# Patient Record
Sex: Male | Born: 1970 | Race: White | Hispanic: No | Marital: Married | Smoking: Never smoker
Health system: Southern US, Community
[De-identification: ages and names within clinical notes are randomized; demographics above are authoritative.]

## PROBLEM LIST (undated history)

## (undated) DIAGNOSIS — R112 Nausea with vomiting, unspecified: Secondary | ICD-10-CM

## (undated) DIAGNOSIS — I10 Essential (primary) hypertension: Secondary | ICD-10-CM

## (undated) DIAGNOSIS — K219 Gastro-esophageal reflux disease without esophagitis: Secondary | ICD-10-CM

## (undated) DIAGNOSIS — N200 Calculus of kidney: Secondary | ICD-10-CM

## (undated) DIAGNOSIS — F419 Anxiety disorder, unspecified: Secondary | ICD-10-CM

## (undated) DIAGNOSIS — T4145XA Adverse effect of unspecified anesthetic, initial encounter: Secondary | ICD-10-CM

## (undated) DIAGNOSIS — K620 Anal polyp: Secondary | ICD-10-CM

## (undated) DIAGNOSIS — T7840XA Allergy, unspecified, initial encounter: Secondary | ICD-10-CM

## (undated) DIAGNOSIS — G4733 Obstructive sleep apnea (adult) (pediatric): Secondary | ICD-10-CM

## (undated) DIAGNOSIS — M7541 Impingement syndrome of right shoulder: Secondary | ICD-10-CM

## (undated) DIAGNOSIS — K6282 Dysplasia of anus: Secondary | ICD-10-CM

## (undated) DIAGNOSIS — Z9889 Other specified postprocedural states: Secondary | ICD-10-CM

## (undated) DIAGNOSIS — R635 Abnormal weight gain: Secondary | ICD-10-CM

## (undated) DIAGNOSIS — K649 Unspecified hemorrhoids: Secondary | ICD-10-CM

## (undated) DIAGNOSIS — G4730 Sleep apnea, unspecified: Secondary | ICD-10-CM

## (undated) DIAGNOSIS — J309 Allergic rhinitis, unspecified: Secondary | ICD-10-CM

## (undated) DIAGNOSIS — J3089 Other allergic rhinitis: Secondary | ICD-10-CM

## (undated) DIAGNOSIS — Z87442 Personal history of urinary calculi: Secondary | ICD-10-CM

## (undated) HISTORY — DX: Abnormal weight gain: R63.5

## (undated) HISTORY — DX: Gastro-esophageal reflux disease without esophagitis: K21.9

## (undated) HISTORY — DX: Obstructive sleep apnea (adult) (pediatric): G47.33

## (undated) HISTORY — DX: Impingement syndrome of right shoulder: M75.41

## (undated) HISTORY — DX: Anxiety disorder, unspecified: F41.9

---
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
## 2014-01-17 ENCOUNTER — Emergency Department (HOSPITAL_COMMUNITY)
Admission: EM | Admit: 2014-01-17 | Discharge: 2014-01-17 | Disposition: A | Discharge status: Home / Self Care | Payer: BC Managed Care – PPO | Attending: Emergency Medicine | Admitting: Emergency Medicine

## 2014-01-17 ENCOUNTER — Emergency Department (HOSPITAL_COMMUNITY): Payer: BC Managed Care – PPO

## 2014-01-17 ENCOUNTER — Encounter (HOSPITAL_COMMUNITY): Payer: Self-pay | Admitting: Emergency Medicine

## 2014-01-17 DIAGNOSIS — Z87442 Personal history of urinary calculi: Secondary | ICD-10-CM | POA: Insufficient documentation

## 2014-01-17 DIAGNOSIS — M542 Cervicalgia: Secondary | ICD-10-CM | POA: Insufficient documentation

## 2014-01-17 DIAGNOSIS — I10 Essential (primary) hypertension: Secondary | ICD-10-CM | POA: Insufficient documentation

## 2014-01-17 DIAGNOSIS — R52 Pain, unspecified: Secondary | ICD-10-CM

## 2014-01-17 DIAGNOSIS — M62838 Other muscle spasm: Secondary | ICD-10-CM

## 2014-01-17 DIAGNOSIS — Z79899 Other long term (current) drug therapy: Secondary | ICD-10-CM | POA: Insufficient documentation

## 2014-01-17 DIAGNOSIS — M549 Dorsalgia, unspecified: Secondary | ICD-10-CM | POA: Insufficient documentation

## 2014-01-17 DIAGNOSIS — R2231 Localized swelling, mass and lump, right upper limb: Secondary | ICD-10-CM | POA: Diagnosis present

## 2014-01-17 HISTORY — DX: Essential (primary) hypertension: I10

## 2014-01-17 HISTORY — DX: Calculus of kidney: N20.0

## 2014-01-17 LAB — CBC WITH DIFFERENTIAL/PLATELET
Basophils Absolute: 0 10*3/uL (ref 0.0–0.1)
Basophils Relative: 0 % (ref 0–1)
EOS ABS: 0.1 10*3/uL (ref 0.0–0.7)
EOS PCT: 2 % (ref 0–5)
HCT: 44.3 % (ref 39.0–52.0)
Hemoglobin: 15.3 g/dL (ref 13.0–17.0)
Lymphocytes Relative: 10 % — ABNORMAL LOW (ref 12–46)
Lymphs Abs: 0.7 10*3/uL (ref 0.7–4.0)
MCH: 28.8 pg (ref 26.0–34.0)
MCHC: 34.5 g/dL (ref 30.0–36.0)
MCV: 83.4 fL (ref 78.0–100.0)
MONOS PCT: 9 % (ref 3–12)
Monocytes Absolute: 0.7 10*3/uL (ref 0.1–1.0)
Neutro Abs: 6 10*3/uL (ref 1.7–7.7)
Neutrophils Relative %: 79 % — ABNORMAL HIGH (ref 43–77)
PLATELETS: 197 10*3/uL (ref 150–400)
RBC: 5.31 MIL/uL (ref 4.22–5.81)
RDW: 13 % (ref 11.5–15.5)
WBC: 7.6 10*3/uL (ref 4.0–10.5)

## 2014-01-17 LAB — BASIC METABOLIC PANEL
Anion gap: 13 (ref 5–15)
BUN: 15 mg/dL (ref 6–23)
CALCIUM: 8.7 mg/dL (ref 8.4–10.5)
CO2: 26 mEq/L (ref 19–32)
Chloride: 99 mEq/L (ref 96–112)
Creatinine, Ser: 0.9 mg/dL (ref 0.50–1.35)
GFR calc Af Amer: >90 mL/min (ref >90)
Glucose, Bld: 112 mg/dL — ABNORMAL HIGH (ref 70–99)
Potassium: 6.9 mEq/L (ref 3.7–5.3)
SODIUM: 138 meq/L (ref 137–147)

## 2014-01-17 LAB — D-DIMER, QUANTITATIVE
D-Dimer, Quant: 0.46 ug/mL-FEU (ref 0.00–0.48)
D-Dimer, Quant: 0.29 ug/mL-FEU (ref 0.00–0.48)

## 2014-01-17 LAB — TROPONIN I: Troponin I: <0.3 ng/mL (ref <0.30)

## 2014-01-17 LAB — POTASSIUM: Potassium: 4 mEq/L (ref 3.7–5.3)

## 2014-01-17 MED ORDER — DIAZEPAM 5 MG PO TABS
5.0000 mg | ORAL_TABLET | Freq: Once | ORAL | Status: AC
  Administered 2014-01-17: 5 mg via ORAL
  Filled 2014-01-17: qty 1

## 2014-01-17 MED ORDER — ONDANSETRON HCL 4 MG PO TABS
4.0000 mg | ORAL_TABLET | Freq: Once | ORAL | Status: AC
  Administered 2014-01-17: 4 mg via ORAL
  Filled 2014-01-17: qty 1

## 2014-01-17 MED ORDER — HYDROMORPHONE HCL 1 MG/ML IJ SOLN
1.0000 mg | Freq: Once | INTRAMUSCULAR | Status: AC
  Administered 2014-01-17: 1 mg via INTRAMUSCULAR
  Filled 2014-01-17: qty 1

## 2014-01-17 MED ORDER — IBUPROFEN 800 MG PO TABS: 800.0000 mg | ORAL_TABLET | Freq: Three times a day (TID) | ORAL | Status: DC

## 2014-01-17 MED ORDER — DIAZEPAM 2 MG PO TABS: 2.0000 mg | ORAL_TABLET | Freq: Two times a day (BID) | ORAL | Status: DC

## 2014-01-17 MED ORDER — HYDROCODONE-ACETAMINOPHEN 5-325 MG PO TABS: 2.0000 | ORAL_TABLET | ORAL | Status: DC | PRN

## 2014-01-17 MED ORDER — ONDANSETRON HCL 4 MG PO TABS: 4.0000 mg | ORAL_TABLET | Freq: Three times a day (TID) | ORAL | Status: DC | PRN

## 2014-01-17 MED ORDER — KETOROLAC TROMETHAMINE 60 MG/2ML IM SOLN
60.0000 mg | Freq: Once | INTRAMUSCULAR | Status: AC
  Administered 2014-01-17: 60 mg via INTRAMUSCULAR
  Filled 2014-01-17: qty 2

## 2014-01-17 MED ORDER — ONDANSETRON HCL 4 MG/2ML IJ SOLN
4.0000 mg | Freq: Once | INTRAMUSCULAR | Status: AC
  Administered 2014-01-17: 4 mg via INTRAVENOUS
  Filled 2014-01-17: qty 2

## 2014-01-17 MED ORDER — ONDANSETRON HCL 4 MG/2ML IJ SOLN
4.0000 mg | Freq: Once | INTRAMUSCULAR | Status: DC
  Filled 2014-01-17: qty 2

## 2014-01-17 NOTE — ED Notes (Signed)
Patient transported to X-ray 

## 2014-01-17 NOTE — Discharge Instructions (Signed)

## 2014-01-17 NOTE — ED Provider Notes (Signed)
CSN: [id number]     Arrival date & time 01/17/14  0350 History   First MD Initiated Contact with Patient 01/17/14 [id number]     Chief Complaint  Patient presents with  . Arm Swelling     (Consider location/radiation/quality/duration/timing/severity/associated sxs/prior Treatment) HPI Comments: Patient complains of right arm pain and upper back pain and neck pain. He states he got his flu shot yesterday around 4 PM and noticed around 10 PM and he had shooting pain in his right proximal arm radiating to his neck and upper back. Use some BenGay without relief. He reports difficulty using the right arm due to pain. Denies Chest pain or shortness of breath. Denies any falls or injuries. Denies any previous history of neck problems or back problems. Admits that he was working out recently but denies any injury. Denies any numbness or tingling.  The history is provided by the patient.    Past Medical History  Diagnosis Date  . Hypertension   . Kidney calculus    History reviewed. No pertinent past surgical history. History reviewed. No pertinent family history. History  Substance Use Topics  . Smoking status: Never Smoker   . Smokeless tobacco: Never Used  . Alcohol Use: No    Review of Systems  Constitutional: Negative for fever, activity change and appetite change.  Respiratory: Negative for cough, chest tightness and shortness of breath.   Cardiovascular: Negative for chest pain.  Gastrointestinal: Negative for nausea, vomiting and abdominal pain.  Genitourinary: Negative for dysuria and hematuria.  Musculoskeletal: Positive for arthralgias and myalgias. Negative for neck pain and neck stiffness.  Skin: Negative for rash.  Neurological: Negative for dizziness, weakness and headaches.  A complete 10 system review of systems was obtained and all systems are negative except as noted in the HPI and PMH.      Allergies  Review of patient's allergies indicates no known  allergies.  Home Medications   Prior to Admission medications   Medication Sig Start Date End Date Taking? Authorizing Provider  BYSTOLIC 10 MG tablet Take 10 mg by mouth daily.  12/25/13  Yes Historical Provider, MD  diazepam (VALIUM) 2 MG tablet Take 1 tablet (2 mg total) by mouth 2 (two) times daily. 01/17/14   Ezequiel Essex, MD  HYDROcodone-acetaminophen (NORCO/VICODIN) 5-325 MG per tablet Take 2 tablets by mouth every 4 (four) hours as needed. 01/17/14   Ezequiel Essex, MD  ibuprofen (ADVIL,MOTRIN) 800 MG tablet Take 1 tablet (800 mg total) by mouth 3 (three) times daily. 01/17/14   Ezequiel Essex, MD  ondansetron (ZOFRAN) 4 MG tablet Take 1 tablet (4 mg total) by mouth every 8 (eight) hours as needed for nausea or vomiting. 01/17/14   Debby Freiberg, MD   BP 137/88  Pulse 65  Temp(Src) 97.9 F (36.6 C) (Oral)  Resp 13  Ht 5\' 9"  (1.753 m)  Wt 195 lb (88.451 kg)  BMI 28.78 kg/m2  SpO2 98% Physical Exam  Nursing note and vitals reviewed. Constitutional: He is oriented to person, place, and time. He appears well-developed and well-nourished. He appears distressed.  uncomfortable  HENT:  Head: Normocephalic and atraumatic.  Mouth/Throat: Oropharynx is clear and moist. No oropharyngeal exudate.  Eyes: Conjunctivae and EOM are normal. Pupils are equal, round, and reactive to light.  Neck: Normal range of motion. Neck supple.  No meningismus.  Cardiovascular: Normal rate, regular rhythm, normal heart sounds and intact distal pulses.   No murmur heard. Pulmonary/Chest: Effort normal and breath sounds normal. No respiratory  distress.  Abdominal: Soft. There is no tenderness. There is no rebound and no guarding.  Musculoskeletal: Normal range of motion. He exhibits tenderness. He exhibits no edema.  TTP RUE with spasm. Able to abduct shoulder to horizontal. Tenderness to right upper trapezius and rhomboid with spasm Intact radial pulse. Intact cardinal hand movements.   Neurological: He is alert and oriented to person, place, and time. No cranial nerve deficit. He exhibits normal muscle tone. Coordination normal.  No ataxia on finger to nose bilaterally. No pronator drift. 5/5 strength throughout. CN 2-12 intact. Negative Romberg. Equal grip strength. Sensation intact. Gait is normal.   Skin: Skin is warm.  Psychiatric: He has a normal mood and affect. His behavior is normal.    ED Course  Procedures (including critical care time) Labs Review Labs Reviewed  CBC WITH DIFFERENTIAL - Abnormal; Notable for the following:    Neutrophils Relative % 79 (*)    Lymphocytes Relative 10 (*)    All other components within normal limits  BASIC METABOLIC PANEL - Abnormal; Notable for the following:    Potassium 6.9 (*)    Glucose, Bld 112 (*)    All other components within normal limits  TROPONIN I  D-DIMER, QUANTITATIVE  D-DIMER, QUANTITATIVE  POTASSIUM    Imaging Review Dg Chest 2 View  01/17/2014   CLINICAL DATA:  Neck and arm pain after flu shot today.  EXAM: CHEST  2 VIEW  COMPARISON:  None.  FINDINGS: The heart size and mediastinal contours are within normal limits. Both lungs are clear. The visualized skeletal structures are unremarkable.  IMPRESSION: No active cardiopulmonary disease.   Electronically Signed   By: Lucienne Capers M.D.   On: 01/17/2014 05:31   Dg Cervical Spine Complete  01/17/2014   CLINICAL DATA:  Flu shot today and now having neck and arm pain.  EXAM: CERVICAL SPINE  4+ VIEWS  COMPARISON:  None.  FINDINGS: There is no evidence of cervical spine fracture or prevertebral soft tissue swelling. Alignment is normal. No other significant bone abnormalities are identified.  IMPRESSION: Negative cervical spine radiographs.   Electronically Signed   By: Lucienne Capers M.D.   On: 01/17/2014 05:31     EKG Interpretation   Date/Time:  Friday January 17 2014 07:08:18 EDT Ventricular Rate:  85 PR Interval:  188 QRS Duration: 95 QT  Interval:  373 QTC Calculation: 443 R Axis:   51 Text Interpretation:  Sinus rhythm RSR' in V1 or V2, probably normal  variant Minimal ST elevation, inferior leads No previous ECGs available  Confirmed by Kemp [phone]) on 01/17/2014 7:26:30 AM      MDM   Final diagnoses:  Pain  Muscle spasm of right shoulder   Right arm, neck and upper back pain after receiving a flu shot. No CP or SOB.  Suspect muscle spasm.  Patient will receive narcotics and muscle relaxers.  EKG without acute ST changes. Troponin negative. D-dimer negative. Cervical spine xray negative.  Pain improved after treatment  Grip strengths equal. Distal sensation equal.  ROM of shoulder full. Elevated potassium on labs, likely from hemolysis.  Repeat pending at time of sign out to Dr. Colin Rhein.  Treat for muscle spasm. Apply heat. Follow up with PCP. Return precautions discussed.  Ezequiel Essex, MD 01/17/14 1440

## 2014-01-17 NOTE — ED Notes (Signed)
Pt states he got his flu shot yesterday at 4 pm, around 2200 pt started feeling some sharp pain on his right arm that is progressively getting worse to the point that he can't move his right arm or his neck to the right side.

## 2014-05-06 ENCOUNTER — Encounter: Payer: Self-pay | Admitting: Neurology

## 2014-05-07 ENCOUNTER — Ambulatory Visit (INDEPENDENT_AMBULATORY_CARE_PROVIDER_SITE_OTHER): Payer: BLUE CROSS/BLUE SHIELD | Admitting: Neurology

## 2014-05-07 ENCOUNTER — Encounter: Payer: Self-pay | Admitting: Neurology

## 2014-05-07 VITALS — BP 152/105 | HR 60 | Temp 97.4°F | Resp 17 | Ht 69.5 in | Wt 201.0 lb

## 2014-05-07 DIAGNOSIS — G4719 Other hypersomnia: Secondary | ICD-10-CM

## 2014-05-07 DIAGNOSIS — E663 Overweight: Secondary | ICD-10-CM

## 2014-05-07 DIAGNOSIS — I10 Essential (primary) hypertension: Secondary | ICD-10-CM

## 2014-05-07 DIAGNOSIS — G4733 Obstructive sleep apnea (adult) (pediatric): Secondary | ICD-10-CM

## 2014-05-07 NOTE — Progress Notes (Signed)
Subjective:    Patient ID: Joshua Collins is a 44 y.o. male.  HPI     Star Age, MD, PhD [facility] [address] [place], [place] [address]  Dear Dr. Ernie Hew,   I saw your patient, Joshua Collins, upon your kind request, in my neurologic clinic today for initial consultation of his sleep disorder, in particular, concern for underlying obstructive sleep apnea. The patient is unaccompanied today. As you know, Joshua Collins is a 44 year old right-handed gentleman with an underlying medical history of allergies, reflux disease, hypertension with elevated blood pressure values at times as well as overweight state, who reports snoring, witnessed apneas, waking up with a gasp at night and multiple nighttime awakenings as well as daytime tiredness. He reports a bedtime of 10 PM and usually falls asleep within 10-15 minutes. His rise time is 6 AM and he feels marginally rested when he wakes up with a poor drinks. He denies morning headaches. He denies nocturia. He denies restless leg symptoms. His sleep is disrupted because of witnessed apneas and he also wakes himself up with his loud snoring at times and a sense of gasping for air. His Epworth sleepiness score is 8 out of a maximum of 24 today. He does not typically nap. He denies parasomnias. He drinks half a cup of coffee each day in the mornings. He is a nonsmoker and rarely drinks alcohol. He has had elevated blood pressure values recently. He has gained weight in the past couple of years. He recently moved from [place] in August 2015 and does endorse work related stress. He is the youngest of 44 children. One of his older brothers has obstructive sleep apnea and uses an oral appliance. He watches TV in bed and usually turns it off before falling asleep. He is not sure if he twitches in his sleep. He has 3 children, ages 63, 55 and 27. They have 2 pets, none of them in the bedroom.   His Past Medical  History Is Significant For: Past Medical History  Diagnosis Date  . Hypertension   . Kidney calculus   . GERD (gastroesophageal reflux disease)   . Abnormal weight gain   . Impingement syndrome of right shoulder     His Past Surgical History Is Significant For: No past surgical history on file.  His Family History Is Significant For: Family History  Problem Relation Age of Onset  . Parkinson's disease Father   . Hypertension Brother   . Seizures Mother   . Heart attack Maternal Uncle     His Social History Is Significant For: History   Social History  . Marital Status: Married    Spouse Name: [name]  . Number of Children: 3  . Years of Education: BSEE   Occupational History  .      [employer]   Social History Main Topics  . Smoking status: Never Smoker   . Smokeless tobacco: Never Used  . Alcohol Use: No  . Drug Use: No  . Sexual Activity: Not on file   Other Topics Concern  . None   Social History Narrative   Consumes 1 cup of caffeine daily    His Allergies Are:  Allergies  Allergen Reactions  . Seasonal Ic [Cholestatin]     Runny nose, itchy eyes  :   His Current Medications Are:  Outpatient Encounter Prescriptions as of 05/07/2014  Medication Sig  . BYSTOLIC 10 MG tablet Take 10 mg by mouth  daily.   . loratadine (CLARITIN) 10 MG tablet Take 10 mg by mouth daily.  Marland Kitchen LOSARTAN POTASSIUM PO Take 50 mg by mouth daily. 2 tablets daily  . [DISCONTINUED] diazepam (VALIUM) 2 MG tablet Take 1 tablet (2 mg total) by mouth 2 (two) times daily. (Patient not taking: Reported on 05/07/2014)  . [DISCONTINUED] diclofenac sodium (VOLTAREN) 1 % GEL Apply topically 4 (four) times daily.  . [DISCONTINUED] diclofenac sodium (VOLTAREN) 1 % GEL Apply 2 g topically 4 (four) times daily.  . [DISCONTINUED] HYDROcodone-acetaminophen (NORCO/VICODIN) 5-325 MG per tablet Take 2 tablets by mouth every 4 (four) hours as needed.  . [DISCONTINUED] ibuprofen (ADVIL,MOTRIN) 800  MG tablet Take 1 tablet (800 mg total) by mouth 3 (three) times daily. (Patient not taking: Reported on 05/07/2014)  . [DISCONTINUED] montelukast (SINGULAIR) 10 MG tablet Take 10 mg by mouth daily at 2 PM daily at 2 PM. In the evening  . [DISCONTINUED] ondansetron (ZOFRAN) 4 MG tablet Take 1 tablet (4 mg total) by mouth every 8 (eight) hours as needed for nausea or vomiting.  :  Review of Systems:  Out of a complete 14 point review of systems, all are reviewed and negative with the exception of these symptoms as listed below:   Review of Systems  Constitutional:       Weight gain  Neurological:       Snoring    Objective:  Neurologic Exam  Physical Exam Physical Examination:   Filed Vitals:   05/07/14 0831  BP: 152/105  Pulse: 60  Temp:   Resp: 17    General Examination: The patient is a very pleasant 44 y.o. male in no acute distress. He appears well-developed and well-nourished and very well groomed.   HEENT: Normocephalic, atraumatic, pupils are equal, round and reactive to light and accommodation. Funduscopic exam is normal with sharp disc margins noted. Extraocular tracking is good without limitation to gaze excursion or nystagmus noted. Normal smooth pursuit is noted. Hearing is grossly intact. Tympanic membranes are clear bilaterally. Face is symmetric with normal facial animation and normal facial sensation. Speech is clear with no dysarthria noted. There is no hypophonia. There is no lip, neck/head, jaw or voice tremor. Neck is supple with full range of passive and active motion. There are no carotid bruits on auscultation. Oropharynx exam reveals: mild mouth dryness, good dental hygiene and mild airway crowding, due to narrow airway entry and thick her tongue. Tonsils are absent. Mallampati is class II. Neck size is 16-7/8 inches. He has a very mild overbite.   Chest: Clear to auscultation without wheezing, rhonchi or crackles noted.  Heart: S1+S2+0, regular and normal  without murmurs, rubs or gallops noted.   Abdomen: Soft, non-tender and non-distended with normal bowel sounds appreciated on auscultation.  Extremities: There is no pitting edema in the distal lower extremities bilaterally. Pedal pulses are intact.  Skin: Warm and dry without trophic changes noted. There are no varicose veins.  Musculoskeletal: exam reveals no obvious joint deformities, tenderness or joint swelling or erythema.   Neurologically:  Mental status: The patient is awake, alert and oriented in all 4 spheres. His immediate and remote memory, attention, language skills and fund of knowledge are appropriate. There is no evidence of aphasia, agnosia, apraxia or anomia. Speech is clear with normal prosody and enunciation. Thought process is linear. Mood is normal and affect is normal.  Cranial nerves II - XII are as described above under HEENT exam. In addition: shoulder shrug is normal with  equal shoulder height noted. Motor exam: Normal bulk, strength and tone is noted. There is no drift, tremor or rebound. Romberg is negative. Reflexes are 2+ throughout. Babinski: Toes are flexor bilaterally. Fine motor skills and coordination: intact with normal finger taps, normal hand movements, normal rapid alternating patting, normal foot taps and normal foot agility.  Cerebellar testing: No dysmetria or intention tremor on finger to nose testing. Heel to shin is unremarkable bilaterally. There is no truncal or gait ataxia.  Sensory exam: intact to light touch, pinprick, vibration, temperature sense in the upper and lower extremities.  Gait, station and balance: He stands easily. No veering to one side is noted. No leaning to one side is noted. Posture is age-appropriate and stance is narrow based. Gait shows normal stride length and normal pace. No problems turning are noted. He turns en bloc. Tandem walk is unremarkable. Intact toe and heel stance is noted.               Assessment and Plan:   In  summary, Joshua Collins is a very pleasant 44 y.o.-year old male with an underlying medical history of allergies, reflux disease, hypertension with elevated blood pressure values at times as well as overweight state, who reports snoring, witnessed apneas, waking up with a gasp at night and multiple nighttime awakenings as well as daytime tiredness. His history and physical exam are in keeping with obstructive sleep apnea (OSA).  I had a long chat with the patient about my findings and the diagnosis of OSA, its prognosis and treatment options. We talked about medical treatments, surgical interventions and non-pharmacological approaches. I explained in particular the risks and ramifications of untreated moderate to severe OSA, especially with respect to developing cardiovascular disease down the Road, including congestive heart failure, difficult to treat hypertension, cardiac arrhythmias, or stroke. Even type 2 diabetes has, in part, been linked to untreated OSA. Symptoms of untreated OSA include daytime sleepiness, memory problems, mood irritability and mood disorder such as depression and anxiety, lack of energy, as well as recurrent headaches, especially morning headaches. We talked about trying to maintain a healthy lifestyle in general, as well as the importance of weight control. I encouraged the patient to eat healthy, exercise daily and keep well hydrated, to keep a scheduled bedtime and wake time routine, to not skip any meals and eat healthy snacks in between meals. I advised the patient not to drive when feeling sleepy. I recommended the following at this time: sleep study with potential positive airway pressure titration. (We will score hypopneas at 3% and split the sleep study into diagnostic and treatment portion, if the estimated. 2 hour AHI is >15/h).   I explained the sleep test procedure to the patient and also outlined possible surgical and non-surgical treatment options of OSA, including the  use of a custom-made dental device (which would require a referral to a specialist dentist or oral surgeon), upper airway surgical options, such as pillar implants, radiofrequency surgery, tongue base surgery, and UPPP (which would involve a referral to an ENT surgeon). Rarely, jaw surgery such as mandibular advancement may be considered.  I also explained the CPAP treatment option to the patient, who indicated that he would be reluctant to use CPAP at home and rather use a dental device if he needs treatment. Nevertheless, he is willing to try CPAP during the sleep study if he qualifies for it. I explained the importance of being compliant with PAP treatment at home, not only for insurance purposes  but primarily to improve His symptoms, and for the patient's long term health benefit, including to reduce His cardiovascular risks. I answered all his questions today and the patient was in agreement. I would like to see him back after the sleep study is completed and encouraged him to call with any interim questions, concerns, problems or updates.   Thank you very much for allowing me to participate in the care of this nice patient. If I can be of any further assistance to you please do not hesitate to call me at [phone].  Sincerely,   Star Age, MD, PhD

## 2014-05-07 NOTE — Patient Instructions (Signed)

## 2014-06-02 ENCOUNTER — Ambulatory Visit (INDEPENDENT_AMBULATORY_CARE_PROVIDER_SITE_OTHER): Payer: BLUE CROSS/BLUE SHIELD | Admitting: Neurology

## 2014-06-02 VITALS — BP 155/101

## 2014-06-02 DIAGNOSIS — G4733 Obstructive sleep apnea (adult) (pediatric): Secondary | ICD-10-CM

## 2014-06-02 DIAGNOSIS — G479 Sleep disorder, unspecified: Secondary | ICD-10-CM

## 2014-06-02 NOTE — Sleep Study (Signed)
Please see the scanned sleep study interpretation located in the Procedure tab within the Chart Review section. 

## 2014-06-13 ENCOUNTER — Telehealth: Payer: Self-pay | Admitting: Neurology

## 2014-06-13 DIAGNOSIS — G4733 Obstructive sleep apnea (adult) (pediatric): Secondary | ICD-10-CM

## 2014-06-13 NOTE — Telephone Encounter (Signed)
Please call and notify patient that the recent sleep study confirmed the diagnosis of OSA. He did very well with CPAP during the study with significant improvement of the respiratory events. Therefore, I would like start the patient on CPAP at home. I placed the order in the chart. I would like to suggest the [employer] for him.    Arrange for CPAP set up at home through a DME company of patient's choice and fax/route report to PCP and referring MD (if other than PCP).   The patient will also need a follow up appointment with me in 6-8 weeks post set up that has to be scheduled; help the patient schedule this (in a follow-up slot).   Please re-enforce the importance of compliance with treatment and the need for [place] to monitor compliance data.   Once you have spoken to the patient and scheduled the return appointment, you may close this encounter, thanks,   Star Age, MD, PhD [facility]

## 2014-06-16 ENCOUNTER — Encounter: Payer: Self-pay | Admitting: *Deleted

## 2014-06-16 ENCOUNTER — Encounter: Payer: Self-pay | Admitting: Neurology

## 2014-06-16 NOTE — Telephone Encounter (Signed)
Patient was contacted and provided the results of his sleep study which did confirm the diagnosis of OSA.  Patient was informed that CPAP therapy was considered effective in treatment.  It was explained to the patient the benefits of using CPAP and the health risks for untreated OSA.  Patient was of an understanding and was referred to [facility] for CPAP set up.  The patient's PCP was faxed a copy of the report.   Patient instructed to contact our office 6-8 weeks post set up to schedule a follow up appointment.  The patient gave verbal permission to mail a copy of his test results.

## 2014-08-28 ENCOUNTER — Other Ambulatory Visit: Payer: Self-pay | Admitting: Chiropractic Medicine

## 2014-08-28 ENCOUNTER — Ambulatory Visit
Admission: RE | Admit: 2014-08-28 | Discharge: 2014-08-28 | Disposition: A | Discharge status: Home / Self Care | Payer: BLUE CROSS/BLUE SHIELD | Source: Ambulatory Visit | Attending: Chiropractic Medicine | Admitting: Chiropractic Medicine

## 2014-08-28 DIAGNOSIS — R609 Edema, unspecified: Secondary | ICD-10-CM

## 2014-08-28 DIAGNOSIS — R52 Pain, unspecified: Secondary | ICD-10-CM

## 2014-12-02 NOTE — Telephone Encounter (Signed)
I have not spoke to patient yet. His sleep study was 06/02/14. If he was never set up, is it ok to send orders in for him? Or do you need to see him first?

## 2014-12-02 NOTE — Telephone Encounter (Signed)
Ok to proceed with cpap set up.

## 2014-12-02 NOTE — Telephone Encounter (Signed)
I spoke to patient. We will refer him to [facility]. He is aware that I will send him a letter to remind him to make an appointment with [place] and the importance of compliance.

## 2014-12-02 NOTE — Telephone Encounter (Signed)
Patient called requesting to go ahead and follow up with recommendation for CPAP set up. Please call patient to advise how to go about that.

## 2015-04-27 ENCOUNTER — Other Ambulatory Visit: Payer: Self-pay | Admitting: Family Medicine

## 2015-04-27 DIAGNOSIS — R1011 Right upper quadrant pain: Secondary | ICD-10-CM

## 2015-05-04 ENCOUNTER — Ambulatory Visit
Admission: RE | Admit: 2015-05-04 | Discharge: 2015-05-04 | Disposition: A | Discharge status: Home / Self Care | Payer: Commercial Managed Care - HMO | Source: Ambulatory Visit | Attending: Family Medicine | Admitting: Family Medicine

## 2015-05-04 DIAGNOSIS — R1011 Right upper quadrant pain: Secondary | ICD-10-CM

## 2015-12-17 ENCOUNTER — Encounter: Payer: Self-pay | Admitting: Gastroenterology

## 2016-02-23 ENCOUNTER — Encounter (INDEPENDENT_AMBULATORY_CARE_PROVIDER_SITE_OTHER): Payer: Self-pay

## 2016-02-23 ENCOUNTER — Ambulatory Visit (INDEPENDENT_AMBULATORY_CARE_PROVIDER_SITE_OTHER): Payer: Commercial Managed Care - HMO | Admitting: Gastroenterology

## 2016-02-23 ENCOUNTER — Encounter: Payer: Self-pay | Admitting: Gastroenterology

## 2016-02-23 VITALS — BP 130/94 | HR 60 | Ht 69.0 in | Wt 195.1 lb

## 2016-02-23 DIAGNOSIS — K625 Hemorrhage of anus and rectum: Secondary | ICD-10-CM | POA: Diagnosis not present

## 2016-02-23 DIAGNOSIS — R194 Change in bowel habit: Secondary | ICD-10-CM | POA: Diagnosis not present

## 2016-02-23 MED ORDER — NA SULFATE-K SULFATE-MG SULF 17.5-3.13-1.6 GM/177ML PO SOLN: 1.0000 | Freq: Once | ORAL | 0 refills | Status: AC

## 2016-02-23 NOTE — Patient Instructions (Signed)
If you are age 45 or older, your body mass index should be between 23-30. Your Body mass index is 28.81 kg/m. If this is out of the aforementioned range listed, please consider follow up with your Primary Care Provider.  If you are age 11 or younger, your body mass index should be between 19-25. Your Body mass index is 28.81 kg/m. If this is out of the aformentioned range listed, please consider follow up with your Primary Care Provider.   We have sent the following medications to your pharmacy for you to pick up at your convenience:  [place] have been scheduled for a colonoscopy. Please follow written instructions given to you at your visit today.  Please pick up your prep supplies at the pharmacy within the next 1-3 days. If you use inhalers (even only as needed), please bring them with you on the day of your procedure. Your physician has requested that you go to www.startemmi.com and enter the access code given to you at your visit today. This web site gives a general overview about your procedure. However, you should still follow specific instructions given to you by our office regarding your preparation for the procedure.

## 2016-02-23 NOTE — Progress Notes (Signed)
HPI :  45 y/o male with a history of OSA, hypertension, reflux, here for new patient evaluation for change in bowel habits.  He has had a chance in stool form for the past 6 months or so. He reports a consistent "groove" or "indetation" along his stools which is present with every solid bowel movement. He has had some scant rectal bleeding periodically during this time as well. He has a BM about 2-3 times per day, normal form. He denies any abdominal pains, mild gas pains. No FH of colon cancer, 2 of his 4 brothers have had colon polyps. He reports brothers were 75 years old when having polyps removed. He is anxious about the possibility of colon cancer. Friend of his had change in bowel habits and led to colon cancer. He denies any symptoms for hemorrhoids.     Past Medical History:  Diagnosis Date  . Abnormal weight gain   . GERD (gastroesophageal reflux disease)   . Hypertension   . Impingement syndrome of right shoulder   . Kidney calculus   . OSA (obstructive sleep apnea)      History reviewed. No pertinent surgical history. Family History  Problem Relation Age of Onset  . Seizures Mother   . Parkinson's disease Father   . Hypertension Brother   . Heart attack Maternal Uncle   . Colon polyps Brother   . Colon polyps Brother   . Colon cancer Neg Hx   . Esophageal cancer Neg Hx   . Stomach cancer Neg Hx   . Rectal cancer Neg Hx   . Liver cancer Neg Hx    Social History  Substance Use Topics  . Smoking status: Never Smoker  . Smokeless tobacco: Never Used  . Alcohol use No   Current Outpatient Prescriptions  Medication Sig Dispense Refill  . BYSTOLIC 10 MG tablet Take 10 mg by mouth daily.     Marland Kitchen loratadine (CLARITIN) 10 MG tablet Take 10 mg by mouth daily.    Marland Kitchen LOSARTAN POTASSIUM PO Take 50 mg by mouth daily. 2 tablets daily    . Na Sulfate-K Sulfate-Mg Sulf 17.5-3.13-1.6 GM/180ML SOLN Take 1 kit by mouth once. 354 mL 0   No current facility-administered  medications for this visit.    Allergies  Allergen Reactions  . Seasonal Ic [Cholestatin]     Runny nose, itchy eyes     Review of Systems: All systems reviewed and negative except where noted in HPI.    Lab Results  Component Value Date   WBC 7.6 01/17/2014   HGB 15.3 01/17/2014   HCT 44.3 01/17/2014   MCV 83.4 01/17/2014   PLT 197 01/17/2014   No results found for: ALT, AST, GGT, ALKPHOS, BILITOT   Physical Exam: BP (!) 130/94   Pulse 60   Ht 5' 9"  (1.753 m)   Wt 195 lb 2 oz (88.5 kg)   BMI 28.81 kg/m  Constitutional: Pleasant,well-developed,male in no acute distress. HEENT: Normocephalic and atraumatic. Conjunctivae are normal. No scleral icterus. Neck supple.  Cardiovascular: Normal rate, regular rhythm.  Pulmonary/chest: Effort normal and breath sounds normal. No wheezing, rales or rhonchi. Abdominal: Soft, nondistended, nontender. There are no masses palpable. No hepatomegaly. Extremities: no edema Lymphadenopathy: No cervical adenopathy noted. Neurological: Alert and oriented to person place and time. Skin: Skin is warm and dry. No rashes noted. Psychiatric: Normal mood and affect. Behavior is normal.   ASSESSMENT AND PLAN: 45 y/o male with history as above, presenting for change in  stool form ongoing for 6 months - he endorses a persistent "indentation" in his stool, along with occasional scant bleeding. This may simply be related to hemorrhoids however I offered him a colonoscopy given his age to ensure no polypoid lesion causing this. Following a discussion of risks / benefits of endoscopy and anesthesia he strongly wished to proceed with colonoscopy. He is quite anxious about this, had a friend who was diagnosed with colon cancer and for piece of mind wanted to have the exam. Further recommendations pending the results.   [place] Cellar, MD [facility] Pager [phone]  CC: Fanny Bien, MD

## 2016-02-26 ENCOUNTER — Encounter: Payer: Self-pay | Admitting: Gastroenterology

## 2016-02-26 ENCOUNTER — Ambulatory Visit (AMBULATORY_SURGERY_CENTER): Payer: Commercial Managed Care - HMO | Admitting: Gastroenterology

## 2016-02-26 VITALS — BP 146/81 | HR 55 | Temp 98.4°F | Resp 12 | Ht 69.0 in | Wt 195.0 lb

## 2016-02-26 DIAGNOSIS — D120 Benign neoplasm of cecum: Secondary | ICD-10-CM | POA: Diagnosis not present

## 2016-02-26 DIAGNOSIS — K6289 Other specified diseases of anus and rectum: Secondary | ICD-10-CM | POA: Diagnosis not present

## 2016-02-26 DIAGNOSIS — K625 Hemorrhage of anus and rectum: Secondary | ICD-10-CM

## 2016-02-26 MED ORDER — SODIUM CHLORIDE 0.9 % IV SOLN: 500.0000 mL | INTRAVENOUS | Status: DC

## 2016-02-26 NOTE — Progress Notes (Signed)
Called to room to assist during endoscopic procedure.  Patient ID and intended procedure confirmed with present staff. Received instructions for my participation in the procedure from the performing physician.  

## 2016-02-26 NOTE — Op Note (Signed)
[facility] Patient Name: Joshua Collins Procedure Date: 02/26/2016 2:39 PM MRN: [id number] Endoscopist: Remo Lipps P. Odell Choung MD, MD Age: 45 Referring MD:  Date of Birth: [birth year] Gender: Male Account #: [id number] Procedure:                Colonoscopy Indications:              Change in bowel habits - "indentation of the stool"                            along with occasional bleeding Medicines:                Monitored Anesthesia Care Procedure:                Pre-Anesthesia Assessment:                           - Prior to the procedure, a History and Physical                            was performed, and patient medications and                            allergies were reviewed. The patient's tolerance of                            previous anesthesia was also reviewed. The risks                            and benefits of the procedure and the sedation                            options and risks were discussed with the patient.                            All questions were answered, and informed consent                            was obtained. Prior Anticoagulants: The patient has                            taken no previous anticoagulant or antiplatelet                            agents. ASA Grade Assessment: II - A patient with                            mild systemic disease. After reviewing the risks                            and benefits, the patient was deemed in                            satisfactory condition to undergo the procedure.  After obtaining informed consent, the colonoscope                            was passed under direct vision. Throughout the                            procedure, the patient's blood pressure, pulse, and                            oxygen saturations were monitored continuously. The                            Model CF-HQ190L [id number]) scope was introduced                            through the anus  and advanced to the the terminal                            ileum, with identification of the appendiceal                            orifice and IC valve. The colonoscopy was performed                            without difficulty. The patient tolerated the                            procedure well. The quality of the bowel                            preparation was good. The terminal ileum, ileocecal                            valve, appendiceal orifice, and rectum were                            photographed. Scope In: 2:50:13 PM Scope Out: 3:09:22 PM Scope Withdrawal Time: 0 hours 17 minutes 30 seconds  Total Procedure Duration: 0 hours 19 minutes 9 seconds  Findings:                 The perianal and digital rectal examinations were                            normal.                           A 4 mm polyp was found in the ileocecal valve,                            intermittently telescoping into the ileum. The                            polyp was sessile. The polyp was removed with a  cold snare. Resection and retrieval were complete.                           The terminal ileum appeared normal.                           Internal hemorrhoids were found during retroflexion.                           A suspected hypertrophied anal papillae was noted                            on retroflexion at the dentate line. Biopsies were                            taken with a cold forceps for histology to ensure                            no adenomatous change.                           The exam was otherwise without abnormality. Complications:            No immediate complications. Estimated blood loss:                            Minimal. Estimated Blood Loss:     Estimated blood loss was minimal. Impression:               - One 4 mm polyp at the ileocecal valve, removed                            with a cold snare. Resected and retrieved.                           -  The examined portion of the ileum was normal.                           - Internal hemorrhoids.                           - Hypertrophied anal papillae - I suspect this is                            causing the patient's symptoms. Biopsied to ensure                            no adenomatous change.                           - The examination was otherwise normal. Recommendation:           - Patient has a contact number available for                            emergencies. The signs and symptoms  of potential                            delayed complications were discussed with the                            patient. Return to normal activities tomorrow.                            Written discharge instructions were provided to the                            patient.                           - Resume previous diet.                           - Continue present medications.                           - Await pathology results.                           - Consider repeat colonoscopy for surveillance                            based on pathology results.                           - Consider referral to general surgery for removal                            if it is causing symptoms Remo Lipps P. Issa Luster MD, MD 02/26/2016 3:15:36 PM This report has been signed electronically.

## 2016-02-26 NOTE — Patient Instructions (Signed)
Impression/Recommendations:  Polyp handout given to patient. Hemorrhoid handout given to patient.  Repeat colonoscopy for surveillance based on pathology results.  YOU HAD AN ENDOSCOPIC PROCEDURE TODAY AT [facility]:   Refer to the procedure report that was given to you for any specific questions about what was found during the examination.  If the procedure report does not answer your questions, please call your gastroenterologist to clarify.  If you requested that your care partner not be given the details of your procedure findings, then the procedure report has been included in a sealed envelope for you to review at your convenience later.  YOU SHOULD EXPECT: Some feelings of bloating in the abdomen. Passage of more gas than usual.  Walking can help get rid of the air that was put into your GI tract during the procedure and reduce the bloating. If you had a lower endoscopy (such as a colonoscopy or flexible sigmoidoscopy) you may notice spotting of blood in your stool or on the toilet paper. If you underwent a bowel prep for your procedure, you may not have a normal bowel movement for a few days.  Please Note:  You might notice some irritation and congestion in your nose or some drainage.  This is from the oxygen used during your procedure.  There is no need for concern and it should clear up in a day or so.  SYMPTOMS TO REPORT IMMEDIATELY:   Following lower endoscopy (colonoscopy or flexible sigmoidoscopy):  Excessive amounts of blood in the stool  Significant tenderness or worsening of abdominal pains  Swelling of the abdomen that is new, acute For urgent or emergent issues, a gastroenterologist can be reached at any hour by calling [phone].   DIET:  We do recommend a small meal at first, but then you may proceed to your regular diet.  Drink plenty of fluids but you should avoid alcoholic beverages for 24 hours.  ACTIVITY:  You should plan to take it easy for  the rest of today and you should NOT DRIVE or use heavy machinery until tomorrow (because of the sedation medicines used during the test).    FOLLOW UP: Our staff will call the number listed on your records the next business day following your procedure to check on you and address any questions or concerns that you may have regarding the information given to you following your procedure. If we do not reach you, we will leave a message.  However, if you are feeling well and you are not experiencing any problems, there is no need to return our call.  We will assume that you have returned to your regular daily activities without incident.  If any biopsies were taken you will be contacted by phone or by letter within the next 1-3 weeks.  Please call us at [phone] if you have not heard about the biopsies in 3 weeks.    SIGNATURES/CONFIDENTIALITY: You and/or your care partner have signed paperwork which will be entered into your electronic medical record.  These signatures attest to the fact that that the information above on your After Visit Summary has been reviewed and is understood.  Full responsibility of the confidentiality of this discharge information lies with you and/or your care-partner.

## 2016-02-26 NOTE — Progress Notes (Signed)
A and O x3. Report to RN. Tolerated MAC anesthesia well. 

## 2016-02-29 ENCOUNTER — Telehealth: Payer: Self-pay | Admitting: *Deleted

## 2016-02-29 NOTE — Telephone Encounter (Signed)
  Follow up Call-  Call back number 02/26/2016  Post procedure Call Back phone  # [phone]  Permission to leave phone message Yes     Patient questions:  Do you have a fever, pain , or abdominal swelling? No. Pain Score  0 *  Have you tolerated food without any problems? Yes.    Have you been able to return to your normal activities? Yes.    Do you have any questions about your discharge instructions: Diet   No. Medications  No. Follow up visit  No.  Do you have questions or concerns about your Care? Yes.   Patient states that he has a "rash" around his anus following the procedure. He has been using "baby ointment and its getting worse." I told the patient to go his PCP.  Actions: * If pain score is 4 or above: No action needed, pain <4.

## 2016-03-03 ENCOUNTER — Telehealth: Payer: Self-pay

## 2016-03-03 NOTE — Telephone Encounter (Signed)
Pt has an appt scheduled with [facility] on 03/31/16 at 8:45. Pt informed by their office.

## 2016-03-31 ENCOUNTER — Ambulatory Visit: Payer: Self-pay | Admitting: Surgery

## 2016-03-31 DIAGNOSIS — K620 Anal polyp: Secondary | ICD-10-CM | POA: Diagnosis not present

## 2016-03-31 DIAGNOSIS — K641 Second degree hemorrhoids: Secondary | ICD-10-CM | POA: Diagnosis not present

## 2016-03-31 DIAGNOSIS — Z01818 Encounter for other preprocedural examination: Secondary | ICD-10-CM | POA: Diagnosis not present

## 2016-03-31 DIAGNOSIS — K629 Disease of anus and rectum, unspecified: Secondary | ICD-10-CM | POA: Diagnosis not present

## 2016-03-31 DIAGNOSIS — L0390 Cellulitis, unspecified: Secondary | ICD-10-CM | POA: Diagnosis not present

## 2016-03-31 DIAGNOSIS — R21 Rash and other nonspecific skin eruption: Secondary | ICD-10-CM | POA: Diagnosis not present

## 2016-03-31 NOTE — H&P (Signed)
Brien Shiller 03/31/2016 8:49 AM Location: [facility] Patient #: [id number] DOB: 06/19/1970 Married / Language: English / Race: White Male  History of Present Illness [name] MD; 03/31/2016 9:46 AM) The patient is a 46 year old male who presents with anal lesions. Note for "Anal lesions": Pleasant gentleman. History of hemorrhoid problems in the past. Bowels usually under better control with 2-3 bowel movements well-formed. Had some intermittent lumps popping out and bleeding and irritation. Family history of colon polyps in his brothers. Underwent colonoscopy. Small polyp removed approximately. Had a few polyps at the anal verge. Recommended surgical evaluation and probable removal. Patient notes he got rash with the bowel prep concerning for cellulitis. He is due to see a dermatologist. An area of rash in his left armpit as well. No history of eczema or psoriasis that he knows of. His never had abdominal surgery. No prior anorectal interventions. He does not smoke. 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 He can walk a few miles without difficulty. He had bad hemorrhoid flares sitting on a donut and eating Preparation H but notes he's not had any recently. No thrombosed hemorrhoids or interventions.  No personal nor family history of GI/colon cancer, inflammatory bowel disease, irritable bowel syndrome, allergy such as Celiac Sprue, dietary/dairy problems, colitis, ulcers nor gastritis. No recent sick contacts/gastroenteritis. No travel outside the country. No changes in diet. No dysphagia to solids or liquids. No significant heartburn or reflux. No hematochezia, hematemesis, coffee ground emesis. No evidence of prior gastric/peptic ulceration.   Past Surgical History Nance Pear, [place]; 03/31/2016 8:49 AM) Colon Polyp Removal - Colonoscopy  Diagnostic Studies History Nance Pear, [place]; 03/31/2016 8:49 AM) Colonoscopy within last year  Allergies  Nance Pear, CMA; 03/31/2016 8:50 AM) No Known Drug Allergies 03/31/2016  Medication History Nance Pear, CMA; [id number] AM) Bystolic (20MG  Tablet, Oral daily) Active. Losartan Potassium-HCTZ (100-25MG  Tablet, Oral daily) Active. Medications Reconciled  Social History Nance Pear, [place]; 03/31/2016 8:49 AM) Alcohol use Occasional alcohol use. Caffeine use Coffee. No drug use Tobacco use Never smoker.  Family History Nance Pear, [place]; 03/31/2016 8:49 AM) Hypertension Brother.  Other Problems Nance Pear, [place]; 03/31/2016 8:49 AM) High blood pressure Kidney Stone     Review of Systems ([place]; 03/31/2016 8:49 AM) General Not Present- Appetite Loss, Chills, Fatigue, Fever, Night Sweats, Weight Gain and Weight Loss. Skin Not Present- Change in Wart/Mole, Dryness, Hives, Jaundice, New Lesions, Non-Healing Wounds, Rash and Ulcer. HEENT Not Present- Earache, Hearing Loss, Hoarseness, Nose Bleed, Oral Ulcers, Ringing in the Ears, Seasonal Allergies, Sinus Pain, Sore Throat, Visual Disturbances, Wears glasses/contact lenses and Yellow Eyes. Respiratory Present- Snoring. Not Present- Bloody sputum, Chronic Cough, Difficulty Breathing and Wheezing. Breast Not Present- Breast Mass, Breast Pain, Nipple Discharge and Skin Changes. Cardiovascular Not Present- Chest Pain, Difficulty Breathing Lying Down, Leg Cramps, Palpitations, Rapid Heart Rate, Shortness of Breath and Swelling of Extremities. Gastrointestinal Not Present- Abdominal Pain, Bloating, Bloody Stool, Change in Bowel Habits, Chronic diarrhea, Constipation, Difficulty Swallowing, Excessive gas, Gets full quickly at meals, Hemorrhoids, Indigestion, Nausea, Rectal Pain and Vomiting. Male Genitourinary Not Present- Blood in Urine, Change in Urinary Stream, Frequency, Impotence, Nocturia, Painful Urination, Urgency and Urine Leakage. Musculoskeletal Not Present- Back Pain, Joint Pain, Joint Stiffness, Muscle Pain,  Muscle Weakness and Swelling of Extremities. Neurological Not Present- Decreased Memory, Fainting, Headaches, Numbness, Seizures, Tingling, Tremor, Trouble walking and Weakness. Psychiatric Not Present- Anxiety, Bipolar, Change in Sleep Pattern, Depression, Fearful and Frequent crying. Endocrine Not  Present- Cold Intolerance, Excessive Hunger, Hair Changes, Heat Intolerance, Hot flashes and New Diabetes. Hematology Not Present- Blood Thinners, Easy Bruising, Excessive bleeding, Gland problems, HIV and Persistent Infections.  Vitals Bary Castilla Bradford CMA; 03/31/2016 8:50 AM) 03/31/2016 8:50 AM Weight: 198 lb Height: 68in Body Surface Area: 2.04 m Body Mass Index: 30.11 kg/m  Temp.: 98.90F  Pulse: 55 (Regular)  BP: 132/88 (Sitting, Left Arm, Standard)      Physical Exam [name] MD; 03/31/2016 9:40 AM)  General Mental Status-Alert. General Appearance-Not in acute distress. Voice-Normal. Note: Relaxed. Nontoxic.  Integumentary Global Assessment Upon inspection and palpation of skin surfaces of the - Distribution of scalp and body hair is normal. General Characteristics Overall examination of the patient's skin reveals - no rashes and no suspicious lesions.  Head and Neck Head-normocephalic, atraumatic with no lesions or palpable masses. Face Global Assessment - atraumatic, no absence of expression. Neck Global Assessment - no abnormal movements, no decreased range of motion. Trachea-midline. Thyroid Gland Characteristics - non-tender.  Eye Eyeball - Left-Extraocular movements intact, No Nystagmus. Eyeball - Right-Extraocular movements intact, No Nystagmus. Upper Eyelid - Left-No Cyanotic. Upper Eyelid - Right-No Cyanotic.  Chest and Lung Exam Inspection Accessory muscles - No use of accessory muscles in breathing.  Abdomen Note: Abdomen soft. Nontender, nondistended. No guarding. No diastasis. No umbilical nor other hernias  Male  Genitourinary Note: No inguinal hernias. Normal external genitalia. Epididymi, testes, and spermatic cords normal without any masses.  Rectal Note: Very sensitive. Pedunculated polyps felt at anal verge. Most likely consistent with hypertrophic anal polyps versus chronic hemorrhoids. Mild irritation. Minimal blood.  Perianal skin clean with good hygiene. No pruritis ani. No pilonidal disease. No fissure. No abscess/fistula. Normal sphincter tone. No external hemorrhoids. No condyloma warts. Very sensitive and only barely tolerates digital and anoscopic rectal exam. No rectal masses. Exam done with assistance of Medical Assistant in the room.  Peripheral Vascular Upper Extremity Inspection - Left - Not Gangrenous, No Petechiae. Right - Not Gangrenous, No Petechiae.  Neurologic Neurologic evaluation reveals -normal attention span and ability to concentrate, able to name objects and repeat phrases. Appropriate fund of knowledge and normal coordination.  Neuropsychiatric Mental status exam performed with findings of-able to articulate well with normal speech/language, rate, volume and coherence and no evidence of hallucinations, delusions, obsessions or homicidal/suicidal ideation. Orientation-oriented X3.  Musculoskeletal Global Assessment Gait and Station - normal gait and station.  Lymphatic General Lymphatics Description - No Generalized lymphadenopathy.    Assessment & Plan [name] MD; 03/31/2016 9:45 AM)  ANAL POLYP (K62.0) Impression: Pedunculated anal masses most likely hypertrophic papillae or abnormal hemorrhoids. Causing intermittent pain and bleeding.  I recommended examination under anesthesia with removal. Far to sensitive to try and do anything the office. He had many appropriate questions. I answered to his satisfaction. He is interested in proceeding.  He claims he had a cellulitis type reaction to the formal bowel prep. Sounds like he got rather  irritated but I doubt true cellulitis. Usually we just do a mild milk of magnesia prep with these folks.  The anatomy & physiology of the anorectal region was discussed. The pathophysiology of hemorrhoids and differential diagnosis was discussed. Natural history progression was discussed. I stressed the importance of a bowel regimen to have daily soft bowel movements to minimize progression of disease. Goal of one BM / day ideal. Use of wet wipes, warm baths, avoiding straining, etc were emphasized.  Educational handouts further explaining the pathology, treatment options, and  bowel regimen were given as well. The patient expressed understanding.  ENCOUNTER FOR PREOPERATIVE EXAMINATION FOR GENERAL SURGICAL PROCEDURE (Z01.818)  Current Plans You are being scheduled for surgery- Our schedulers will call you.  You should hear from our office's scheduling department within 5 working days about the location, date, and time of surgery. We try to make accommodations for patient's preferences in scheduling surgery, but sometimes the OR schedule or the surgeon's schedule prevents [place] from making those accommodations.  If you have not heard from our office [phone]) in 5 working days, call the office and ask for your surgeon's nurse.  If you have other questions about your diagnosis, plan, or surgery, call the office and ask for your surgeon's nurse.  Pt Education - [facility] Rectal Prep for Anorectal outpatient/office surgery: discussed with patient and provided information. Pt Education - [facility] Rectal Surgery HCI (Melvin Marmo): discussed with patient and provided information. Pt Education - [facility] Pelvic Floor Exercises (Kegels) and Dysfunction HCI (Anahis Furgeson) PROLAPSED INTERNAL HEMORRHOIDS, GRADE 2 (K64.1)  Current Plans Pt Education - Pamphlet Given - The Hemorrhoid Book: discussed with patient and provided information. Pt Education - [facility] Hemorrhoids (Mareesa Gathright): discussed with patient and provided  information.  [name], M.D., F.A.C.S. Gastrointestinal and Minimally Invasive Surgery [facility] [address] [place], [place] [address] [phone] Main / Paging

## 2016-04-29 DIAGNOSIS — Z0000 Encounter for general adult medical examination without abnormal findings: Secondary | ICD-10-CM | POA: Diagnosis not present

## 2016-04-29 DIAGNOSIS — Z125 Encounter for screening for malignant neoplasm of prostate: Secondary | ICD-10-CM | POA: Diagnosis not present

## 2016-05-04 DIAGNOSIS — Z23 Encounter for immunization: Secondary | ICD-10-CM | POA: Diagnosis not present

## 2016-05-04 DIAGNOSIS — Z0000 Encounter for general adult medical examination without abnormal findings: Secondary | ICD-10-CM | POA: Diagnosis not present

## 2016-07-08 DIAGNOSIS — Z23 Encounter for immunization: Secondary | ICD-10-CM | POA: Diagnosis not present

## 2016-07-08 DIAGNOSIS — I10 Essential (primary) hypertension: Secondary | ICD-10-CM | POA: Diagnosis not present

## 2016-09-01 DIAGNOSIS — M9901 Segmental and somatic dysfunction of cervical region: Secondary | ICD-10-CM | POA: Diagnosis not present

## 2016-09-01 DIAGNOSIS — M7551 Bursitis of right shoulder: Secondary | ICD-10-CM | POA: Diagnosis not present

## 2016-09-01 DIAGNOSIS — M7541 Impingement syndrome of right shoulder: Secondary | ICD-10-CM | POA: Diagnosis not present

## 2016-09-05 DIAGNOSIS — M7541 Impingement syndrome of right shoulder: Secondary | ICD-10-CM | POA: Diagnosis not present

## 2016-09-05 DIAGNOSIS — M7551 Bursitis of right shoulder: Secondary | ICD-10-CM | POA: Diagnosis not present

## 2016-09-05 DIAGNOSIS — M9901 Segmental and somatic dysfunction of cervical region: Secondary | ICD-10-CM | POA: Diagnosis not present

## 2016-09-09 DIAGNOSIS — H6123 Impacted cerumen, bilateral: Secondary | ICD-10-CM | POA: Diagnosis not present

## 2016-09-09 DIAGNOSIS — M7551 Bursitis of right shoulder: Secondary | ICD-10-CM | POA: Diagnosis not present

## 2016-09-09 DIAGNOSIS — M7541 Impingement syndrome of right shoulder: Secondary | ICD-10-CM | POA: Diagnosis not present

## 2016-09-09 DIAGNOSIS — M9901 Segmental and somatic dysfunction of cervical region: Secondary | ICD-10-CM | POA: Diagnosis not present

## 2016-09-09 DIAGNOSIS — K629 Disease of anus and rectum, unspecified: Secondary | ICD-10-CM | POA: Diagnosis not present

## 2016-09-09 DIAGNOSIS — R42 Dizziness and giddiness: Secondary | ICD-10-CM | POA: Diagnosis not present

## 2016-09-19 DIAGNOSIS — M7551 Bursitis of right shoulder: Secondary | ICD-10-CM | POA: Diagnosis not present

## 2016-09-19 DIAGNOSIS — M9901 Segmental and somatic dysfunction of cervical region: Secondary | ICD-10-CM | POA: Diagnosis not present

## 2016-09-19 DIAGNOSIS — M7541 Impingement syndrome of right shoulder: Secondary | ICD-10-CM | POA: Diagnosis not present

## 2016-09-23 DIAGNOSIS — M7551 Bursitis of right shoulder: Secondary | ICD-10-CM | POA: Diagnosis not present

## 2016-09-23 DIAGNOSIS — M9901 Segmental and somatic dysfunction of cervical region: Secondary | ICD-10-CM | POA: Diagnosis not present

## 2016-09-23 DIAGNOSIS — M7541 Impingement syndrome of right shoulder: Secondary | ICD-10-CM | POA: Diagnosis not present

## 2016-09-26 DIAGNOSIS — R42 Dizziness and giddiness: Secondary | ICD-10-CM | POA: Diagnosis not present

## 2016-09-26 DIAGNOSIS — J329 Chronic sinusitis, unspecified: Secondary | ICD-10-CM | POA: Diagnosis not present

## 2016-10-14 DIAGNOSIS — I10 Essential (primary) hypertension: Secondary | ICD-10-CM | POA: Diagnosis not present

## 2016-10-14 DIAGNOSIS — Z7721 Contact with and (suspected) exposure to potentially hazardous body fluids: Secondary | ICD-10-CM | POA: Diagnosis not present

## 2016-10-14 DIAGNOSIS — R42 Dizziness and giddiness: Secondary | ICD-10-CM | POA: Diagnosis not present

## 2016-10-14 DIAGNOSIS — Z578 Occupational exposure to other risk factors: Secondary | ICD-10-CM | POA: Diagnosis not present

## 2016-10-14 DIAGNOSIS — J309 Allergic rhinitis, unspecified: Secondary | ICD-10-CM | POA: Diagnosis not present

## 2016-11-11 DIAGNOSIS — Z23 Encounter for immunization: Secondary | ICD-10-CM | POA: Diagnosis not present

## 2016-11-11 DIAGNOSIS — H6123 Impacted cerumen, bilateral: Secondary | ICD-10-CM | POA: Diagnosis not present

## 2016-11-11 DIAGNOSIS — I10 Essential (primary) hypertension: Secondary | ICD-10-CM | POA: Diagnosis not present

## 2017-01-30 DIAGNOSIS — M9902 Segmental and somatic dysfunction of thoracic region: Secondary | ICD-10-CM | POA: Diagnosis not present

## 2017-01-30 DIAGNOSIS — M7702 Medial epicondylitis, left elbow: Secondary | ICD-10-CM | POA: Diagnosis not present

## 2017-01-30 DIAGNOSIS — M9901 Segmental and somatic dysfunction of cervical region: Secondary | ICD-10-CM | POA: Diagnosis not present

## 2017-02-03 DIAGNOSIS — M9902 Segmental and somatic dysfunction of thoracic region: Secondary | ICD-10-CM | POA: Diagnosis not present

## 2017-02-03 DIAGNOSIS — M9901 Segmental and somatic dysfunction of cervical region: Secondary | ICD-10-CM | POA: Diagnosis not present

## 2017-02-03 DIAGNOSIS — M7702 Medial epicondylitis, left elbow: Secondary | ICD-10-CM | POA: Diagnosis not present

## 2017-02-08 DIAGNOSIS — M7702 Medial epicondylitis, left elbow: Secondary | ICD-10-CM | POA: Diagnosis not present

## 2017-02-08 DIAGNOSIS — M9902 Segmental and somatic dysfunction of thoracic region: Secondary | ICD-10-CM | POA: Diagnosis not present

## 2017-02-08 DIAGNOSIS — M9901 Segmental and somatic dysfunction of cervical region: Secondary | ICD-10-CM | POA: Diagnosis not present

## 2017-02-13 DIAGNOSIS — M9901 Segmental and somatic dysfunction of cervical region: Secondary | ICD-10-CM | POA: Diagnosis not present

## 2017-02-13 DIAGNOSIS — M7702 Medial epicondylitis, left elbow: Secondary | ICD-10-CM | POA: Diagnosis not present

## 2017-02-13 DIAGNOSIS — M9902 Segmental and somatic dysfunction of thoracic region: Secondary | ICD-10-CM | POA: Diagnosis not present

## 2017-02-15 DIAGNOSIS — H6123 Impacted cerumen, bilateral: Secondary | ICD-10-CM | POA: Diagnosis not present

## 2017-02-15 DIAGNOSIS — I10 Essential (primary) hypertension: Secondary | ICD-10-CM | POA: Diagnosis not present

## 2017-02-15 DIAGNOSIS — Z23 Encounter for immunization: Secondary | ICD-10-CM | POA: Diagnosis not present

## 2017-02-15 DIAGNOSIS — G4733 Obstructive sleep apnea (adult) (pediatric): Secondary | ICD-10-CM | POA: Diagnosis not present

## 2017-02-20 DIAGNOSIS — M7702 Medial epicondylitis, left elbow: Secondary | ICD-10-CM | POA: Diagnosis not present

## 2017-02-20 DIAGNOSIS — M9902 Segmental and somatic dysfunction of thoracic region: Secondary | ICD-10-CM | POA: Diagnosis not present

## 2017-02-20 DIAGNOSIS — M9901 Segmental and somatic dysfunction of cervical region: Secondary | ICD-10-CM | POA: Diagnosis not present

## 2017-03-08 DIAGNOSIS — M9901 Segmental and somatic dysfunction of cervical region: Secondary | ICD-10-CM | POA: Diagnosis not present

## 2017-03-08 DIAGNOSIS — M7702 Medial epicondylitis, left elbow: Secondary | ICD-10-CM | POA: Diagnosis not present

## 2017-03-08 DIAGNOSIS — M9902 Segmental and somatic dysfunction of thoracic region: Secondary | ICD-10-CM | POA: Diagnosis not present

## 2017-03-17 DIAGNOSIS — M9901 Segmental and somatic dysfunction of cervical region: Secondary | ICD-10-CM | POA: Diagnosis not present

## 2017-03-17 DIAGNOSIS — M9902 Segmental and somatic dysfunction of thoracic region: Secondary | ICD-10-CM | POA: Diagnosis not present

## 2017-03-17 DIAGNOSIS — M7702 Medial epicondylitis, left elbow: Secondary | ICD-10-CM | POA: Diagnosis not present

## 2017-03-23 DIAGNOSIS — M7702 Medial epicondylitis, left elbow: Secondary | ICD-10-CM | POA: Diagnosis not present

## 2017-03-23 DIAGNOSIS — M9902 Segmental and somatic dysfunction of thoracic region: Secondary | ICD-10-CM | POA: Diagnosis not present

## 2017-03-23 DIAGNOSIS — M9901 Segmental and somatic dysfunction of cervical region: Secondary | ICD-10-CM | POA: Diagnosis not present

## 2017-03-31 DIAGNOSIS — M9902 Segmental and somatic dysfunction of thoracic region: Secondary | ICD-10-CM | POA: Diagnosis not present

## 2017-03-31 DIAGNOSIS — M7712 Lateral epicondylitis, left elbow: Secondary | ICD-10-CM | POA: Diagnosis not present

## 2017-03-31 DIAGNOSIS — M9901 Segmental and somatic dysfunction of cervical region: Secondary | ICD-10-CM | POA: Diagnosis not present

## 2017-03-31 DIAGNOSIS — M7702 Medial epicondylitis, left elbow: Secondary | ICD-10-CM | POA: Diagnosis not present

## 2017-03-31 DIAGNOSIS — G4733 Obstructive sleep apnea (adult) (pediatric): Secondary | ICD-10-CM | POA: Diagnosis not present

## 2017-03-31 DIAGNOSIS — I10 Essential (primary) hypertension: Secondary | ICD-10-CM | POA: Diagnosis not present

## 2017-04-04 DIAGNOSIS — M7702 Medial epicondylitis, left elbow: Secondary | ICD-10-CM | POA: Diagnosis not present

## 2017-04-04 DIAGNOSIS — M7918 Myalgia, other site: Secondary | ICD-10-CM | POA: Diagnosis not present

## 2017-04-04 DIAGNOSIS — M25522 Pain in left elbow: Secondary | ICD-10-CM | POA: Diagnosis not present

## 2017-04-28 DIAGNOSIS — H6123 Impacted cerumen, bilateral: Secondary | ICD-10-CM | POA: Diagnosis not present

## 2017-04-28 DIAGNOSIS — K219 Gastro-esophageal reflux disease without esophagitis: Secondary | ICD-10-CM | POA: Diagnosis not present

## 2017-04-28 DIAGNOSIS — I10 Essential (primary) hypertension: Secondary | ICD-10-CM | POA: Diagnosis not present

## 2017-05-01 DIAGNOSIS — K219 Gastro-esophageal reflux disease without esophagitis: Secondary | ICD-10-CM | POA: Diagnosis not present

## 2017-05-12 DIAGNOSIS — Z125 Encounter for screening for malignant neoplasm of prostate: Secondary | ICD-10-CM | POA: Diagnosis not present

## 2017-05-12 DIAGNOSIS — Z0000 Encounter for general adult medical examination without abnormal findings: Secondary | ICD-10-CM | POA: Diagnosis not present

## 2017-05-16 DIAGNOSIS — K219 Gastro-esophageal reflux disease without esophagitis: Secondary | ICD-10-CM | POA: Diagnosis not present

## 2017-05-16 DIAGNOSIS — K5900 Constipation, unspecified: Secondary | ICD-10-CM | POA: Diagnosis not present

## 2017-05-16 DIAGNOSIS — I10 Essential (primary) hypertension: Secondary | ICD-10-CM | POA: Diagnosis not present

## 2017-05-16 DIAGNOSIS — Z0000 Encounter for general adult medical examination without abnormal findings: Secondary | ICD-10-CM | POA: Diagnosis not present

## 2017-05-16 DIAGNOSIS — Z23 Encounter for immunization: Secondary | ICD-10-CM | POA: Diagnosis not present

## 2017-05-22 ENCOUNTER — Ambulatory Visit: Payer: Self-pay | Admitting: Surgery

## 2017-05-22 DIAGNOSIS — K620 Anal polyp: Secondary | ICD-10-CM | POA: Diagnosis not present

## 2017-05-22 DIAGNOSIS — Z01818 Encounter for other preprocedural examination: Secondary | ICD-10-CM | POA: Diagnosis not present

## 2017-05-22 DIAGNOSIS — K641 Second degree hemorrhoids: Secondary | ICD-10-CM | POA: Diagnosis not present

## 2017-05-22 NOTE — H&P (Signed)
Joshua Collins Documented: 05/22/2017 10:47 AM Location: [facility] Patient #: [id number] DOB: [birth year] Married / Language: English / Race: White Male   History of Present Illness Joshua Hector MD; 05/22/2017 11:26 AM) The patient is a 47 year old male who presents with anal lesions. Note for "Anal lesions": ` ` ` Patient sent for surgical consultation at the request of Dr. Lafe Garin  Chief Complaint: Persistent anal polyps and recent hemorrhoid flare. Reconsideration of surgery.  Patient with history of perianal complaints. Colonoscopy revealed some squamous anal polyps. Surgical consultation requested. I saw him over a year ago. I offered examination under anesthesia with removal of anal polyps. This did not happen. He returns a year later. He notes he had a lot of family and work stresses, so he tried to hold off on any intervention. It is not been particularly bothersome but he still notes the change in caliber of stools. Occasional blood. Occasionally irritation. Recently had a hemorrhoid flare with some irregular bowels. He has a few months this year that are potentially more quiet to allow surgery to happen. He is worried about a possible cancer given pain friend that died of colon cancer and brother who has polyps.  History of hemorrhoid problems in the past. Bowels usually under better control with ~2 bowel movements a day well-formed. Had some intermittent lumps popping out and bleeding and irritation. Family history of colon polyps in his brothers. Underwent colonoscopy. Small polyp removed approximately. Had a few polyps at the anal verge. Recommended surgical evaluation and probable removal. Patient notes he got rash with the bowel prep concerning for cellulitis. He is due to see a dermatologist. An area of rash in his left armpit as well. No history of eczema or psoriasis that he knows of. His never had abdominal surgery. No prior  anorectal interventions. He does not smoke. 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 He can walk a few miles without difficulty. He had bad hemorrhoid flares sitting on a donut and eating Preparation H but notes he's not had any recently. No thrombosed hemorrhoids or interventions.  No personal nor family history of GI/colon cancer, inflammatory bowel disease, irritable bowel syndrome, allergy such as Celiac Sprue, dietary/dairy problems, colitis, ulcers nor gastritis. No recent sick contacts/gastroenteritis. No travel outside the country. No changes in diet. No dysphagia to solids or liquids. No significant heartburn or reflux. No hematochezia, hematemesis, coffee ground emesis. No evidence of prior gastric/peptic ulceration.  (Review of systems as stated in this history (HPI) or in the review of systems. Otherwise all other 12 point ROS are negative)   Allergies (Tanisha A. Owens Shark, [place]; 05/22/2017 10:48 AM) No Known Drug Allergies [03/31/2016]: Allergies Reconciled   Medication History (Tanisha A. Owens Shark, [place]; 09/01/3708 62:69 AM) Bystolic (20MG  Tablet, Oral daily) Active. Losartan Potassium-HCTZ (100-25MG  Tablet, Oral daily) Active. Medications Reconciled    Review of Systems Joshua Hector, MD; 05/22/2017 11:1 AM) General Not Present- Appetite Loss, Chills, Fatigue, Fever, Night Sweats, Weight Gain and Weight Loss. Skin Not Present- Change in Wart/Mole, Dryness, Hives, Jaundice, New Lesions, Non-Healing Wounds, Rash and Ulcer. HEENT Not Present- Earache, Hearing Loss, Hoarseness, Nose Bleed, Oral Ulcers, Ringing in the Ears, Seasonal Allergies, Sinus Pain, Sore Throat, Visual Disturbances, Wears glasses/contact lenses and Yellow Eyes. Respiratory Present- Snoring. Not Present- Bloody sputum, Chronic Cough, Difficulty Breathing and Wheezing. Breast Not Present- Breast Mass, Breast Pain, Nipple Discharge and Skin Changes. Cardiovascular Not Present- Chest Pain, Difficulty  Breathing Lying Down, Leg Cramps,  Palpitations, Rapid Heart Rate, Shortness of Breath and Swelling of Extremities. Gastrointestinal Not Present- Abdominal Pain, Bloating, Bloody Stool, Change in Bowel Habits, Chronic diarrhea, Constipation, Difficulty Swallowing, Excessive gas, Gets full quickly at meals, Hemorrhoids, Indigestion, Nausea, Rectal Pain and Vomiting. Male Genitourinary Not Present- Blood in Urine, Change in Urinary Stream, Frequency, Impotence, Nocturia, Painful Urination, Urgency and Urine Leakage. Musculoskeletal Not Present- Back Pain, Joint Pain, Joint Stiffness, Muscle Pain, Muscle Weakness and Swelling of Extremities. Neurological Not Present- Decreased Memory, Fainting, Headaches, Numbness, Seizures, Tingling, Tremor, Trouble walking and Weakness. Psychiatric Not Present- Anxiety, Bipolar, Change in Sleep Pattern, Depression, Fearful and Frequent crying. Endocrine Not Present- Cold Intolerance, Excessive Hunger, Hair Changes, Heat Intolerance and New Diabetes. Hematology Not Present- Blood Thinners, Easy Bruising, Excessive bleeding, Gland problems, HIV and Persistent Infections.  Vitals (Tanisha A. Brown RMA; 05/22/2017 10:48 AM) 05/22/2017 10:47 AM Weight: 193.8 lb Height: 68in Body Surface Area: 2.02 m Body Mass Index: 29.47 kg/m  Temp.: 97.51F  Pulse: 66 (Regular)  BP: 136/82 (Sitting, Left Arm, Standard)       Physical Exam Joshua Hector MD; 05/22/2017 11:22 AM) General Mental Status-Alert. General Appearance-Not in acute distress. Voice-Normal. Note: Relaxed. Nontoxic.   Integumentary Global Assessment Normal Exam - Distribution of scalp and body hair is normal. General Characteristics Overall Skin Surface - no rashes and no suspicious lesions.  Head and Neck Head-normocephalic, atraumatic with no lesions or palpable masses. Face Global Assessment - atraumatic, no absence of expression. Neck Global Assessment - no abnormal  movements, no decreased range of motion. Trachea-midline. Thyroid Gland Characteristics - non-tender.  Eye Eyeball - Left-Extraocular movements intact, No Nystagmus. Eyeball - Right-Extraocular movements intact, No Nystagmus. Upper Eyelid - Left-No Cyanotic. Upper Eyelid - Right-No Cyanotic.  Chest and Lung Exam Inspection Accessory muscles - No use of accessory muscles in breathing.  Abdomen Note: Abdomen soft. Nontender, nondistended. No guarding. No diastasis. No umbilical nor other hernias   Male Genitourinary Note: No inguinal hernias. Normal external genitalia. Epididymi, testes, and spermatic cords normal without any masses.   Rectal Note: ` ` ` Very sensitive. Pedunculated polyps felt at anal verge. Right posterior pedunculated at least 2 cm. Almost comes out of the anal canal Most likely consistent with hypertrophic anal polyps versus chronic hemorrhoids. Mild irritation. Minimal blood.  Perianal skin clean with good hygiene. No pruritis ani. No pilonidal disease. No fissure. No abscess/fistula. Normal sphincter tone.  No external hemorrhoids. No condyloma warts. Very sensitive and only barely tolerates digital and anoscopic rectal exam. No other rectal masses. Prostate normal size and smooth.   Peripheral Vascular Upper Extremity Inspection - Left - Not Gangrenous, No Petechiae. Right - Not Gangrenous, No Petechiae.  Neurologic Neurologic evaluation reveals -normal attention span and ability to concentrate, able to name objects and repeat phrases. Appropriate fund of knowledge and normal coordination.  Neuropsychiatric Mental status exam performed with findings of-able to articulate well with normal speech/language, rate, volume and coherence and no evidence of hallucinations, delusions, obsessions or homicidal/suicidal ideation. Orientation-oriented X3.  Musculoskeletal Global Assessment Gait and Station - normal gait and  station.  Lymphatic General Lymphatics Description - No Generalized lymphadenopathy.    Assessment & Plan Joshua Hector MD; 05/22/2017 11:22 AM) ANAL POLYP (K62.0) Impression: Pedunculated anal masses most likely hypertrophic papillae in the setting of grade 2 internal hemorrhoids. Causing intermittent bleeding and irritation but not to the point of fully prolapsing yet. Right posterior one slightly larger but still feels benign.  I recommended examination under  anesthesia with removal. He is ready to consider it now. Far too sensitive to try and do anything the office. He had many appropriate questions. I answered to his satisfaction. He is interested in proceeding. He is hoping to do it in mid spring or summer time when his travel requirements are not so intense. He will call back to discuss scheduling  He claims he had a cellulitis type reaction to the formal bowel prep. Sounds like he got rather irritated but I doubt true cellulitis. Usually we just do a mild milk of magnesia prep with these folks.  The anatomy & physiology of the anorectal region was discussed. The pathophysiology of hemorrhoids and differential diagnosis was discussed. Natural history progression was discussed. I stressed the importance of a bowel regimen to have daily soft bowel movements to minimize progression of disease. Goal of one BM / day ideal. Use of wet wipes, warm baths, avoiding straining, etc were emphasized.  Educational handouts further explaining the pathology, treatment options, and bowel regimen were given as well. The patient expressed understanding. ENCOUNTER FOR PREOPERATIVE EXAMINATION FOR GENERAL SURGICAL PROCEDURE (Z01.818) Current Plans You are being scheduled for surgery- Our schedulers will call you.  You should hear from our office's scheduling department within 5 working days about the location, date, and time of surgery. We try to make accommodations for patient's preferences in  scheduling surgery, but sometimes the OR schedule or the surgeon's schedule prevents [place] from making those accommodations.  If you have not heard from our office [phone]) in 5 working days, call the office and ask for your surgeon's nurse.  If you have other questions about your diagnosis, plan, or surgery, call the office and ask for your surgeon's nurse.  Pt Education - [facility] Rectal Prep for Anorectal outpatient/office surgery: discussed with patient and provided information. Pt Education - [facility] Rectal Surgery HCI (Kherington Meraz): discussed with patient and provided information. PROLAPSED INTERNAL HEMORRHOIDS, GRADE 2 (K64.1) Impression: May benefit from hemorrhoidal ligation at the same time. We'll determine how the anatomy is once he is asleep and I can get a better examination under anesthesia Current Plans Pt Education - [facility] Hemorrhoids (Scotlynn Noyes): discussed with patient and provided information.   Signed by Joshua Hector, MD (05/22/2017 11:27 AM)  Joshua Collins, M.D., F.A.C.S. Gastrointestinal and Minimally Invasive Surgery [facility] [address], [place], [place] [address] [phone] Main / Paging

## 2017-06-06 DIAGNOSIS — L03116 Cellulitis of left lower limb: Secondary | ICD-10-CM | POA: Diagnosis not present

## 2017-07-12 DIAGNOSIS — I10 Essential (primary) hypertension: Secondary | ICD-10-CM | POA: Diagnosis not present

## 2017-07-12 DIAGNOSIS — K219 Gastro-esophageal reflux disease without esophagitis: Secondary | ICD-10-CM | POA: Diagnosis not present

## 2017-07-12 DIAGNOSIS — K5900 Constipation, unspecified: Secondary | ICD-10-CM | POA: Diagnosis not present

## 2017-08-14 DIAGNOSIS — I10 Essential (primary) hypertension: Secondary | ICD-10-CM | POA: Diagnosis not present

## 2017-08-14 DIAGNOSIS — A084 Viral intestinal infection, unspecified: Secondary | ICD-10-CM | POA: Diagnosis not present

## 2017-08-14 DIAGNOSIS — R509 Fever, unspecified: Secondary | ICD-10-CM | POA: Diagnosis not present

## 2017-08-16 DIAGNOSIS — A041 Enterotoxigenic Escherichia coli infection: Secondary | ICD-10-CM | POA: Diagnosis not present

## 2017-08-18 DIAGNOSIS — M7918 Myalgia, other site: Secondary | ICD-10-CM | POA: Diagnosis not present

## 2017-08-18 DIAGNOSIS — M7542 Impingement syndrome of left shoulder: Secondary | ICD-10-CM | POA: Diagnosis not present

## 2017-08-18 DIAGNOSIS — M25512 Pain in left shoulder: Secondary | ICD-10-CM | POA: Diagnosis not present

## 2017-09-01 ENCOUNTER — Other Ambulatory Visit: Payer: Self-pay | Admitting: Family Medicine

## 2017-09-01 DIAGNOSIS — R1011 Right upper quadrant pain: Secondary | ICD-10-CM

## 2017-09-11 ENCOUNTER — Institutional Professional Consult (permissible substitution): Payer: BLUE CROSS/BLUE SHIELD | Admitting: Neurology

## 2017-09-11 ENCOUNTER — Ambulatory Visit
Admission: RE | Admit: 2017-09-11 | Discharge: 2017-09-11 | Disposition: A | Discharge status: Home / Self Care | Payer: 59 | Source: Ambulatory Visit | Attending: Family Medicine | Admitting: Family Medicine

## 2017-09-11 ENCOUNTER — Telehealth: Payer: Self-pay

## 2017-09-11 ENCOUNTER — Encounter: Payer: Self-pay | Admitting: Neurology

## 2017-09-11 DIAGNOSIS — R1011 Right upper quadrant pain: Secondary | ICD-10-CM

## 2017-09-11 NOTE — Telephone Encounter (Signed)
Pt did not show for their appt with Dr. Athar today.  

## 2017-09-12 DIAGNOSIS — R1011 Right upper quadrant pain: Secondary | ICD-10-CM | POA: Diagnosis not present

## 2017-10-12 DIAGNOSIS — M7541 Impingement syndrome of right shoulder: Secondary | ICD-10-CM | POA: Diagnosis not present

## 2017-10-12 DIAGNOSIS — M9902 Segmental and somatic dysfunction of thoracic region: Secondary | ICD-10-CM | POA: Diagnosis not present

## 2017-10-12 DIAGNOSIS — M9901 Segmental and somatic dysfunction of cervical region: Secondary | ICD-10-CM | POA: Diagnosis not present

## 2017-10-18 DIAGNOSIS — M9902 Segmental and somatic dysfunction of thoracic region: Secondary | ICD-10-CM | POA: Diagnosis not present

## 2017-10-18 DIAGNOSIS — M9901 Segmental and somatic dysfunction of cervical region: Secondary | ICD-10-CM | POA: Diagnosis not present

## 2017-10-18 DIAGNOSIS — M7541 Impingement syndrome of right shoulder: Secondary | ICD-10-CM | POA: Diagnosis not present

## 2017-10-24 DIAGNOSIS — M9902 Segmental and somatic dysfunction of thoracic region: Secondary | ICD-10-CM | POA: Diagnosis not present

## 2017-10-24 DIAGNOSIS — M9901 Segmental and somatic dysfunction of cervical region: Secondary | ICD-10-CM | POA: Diagnosis not present

## 2017-10-24 DIAGNOSIS — M7541 Impingement syndrome of right shoulder: Secondary | ICD-10-CM | POA: Diagnosis not present

## 2017-10-27 DIAGNOSIS — M9901 Segmental and somatic dysfunction of cervical region: Secondary | ICD-10-CM | POA: Diagnosis not present

## 2017-10-27 DIAGNOSIS — M9902 Segmental and somatic dysfunction of thoracic region: Secondary | ICD-10-CM | POA: Diagnosis not present

## 2017-10-27 DIAGNOSIS — M7541 Impingement syndrome of right shoulder: Secondary | ICD-10-CM | POA: Diagnosis not present

## 2017-11-17 DIAGNOSIS — M9901 Segmental and somatic dysfunction of cervical region: Secondary | ICD-10-CM | POA: Diagnosis not present

## 2017-11-17 DIAGNOSIS — M7541 Impingement syndrome of right shoulder: Secondary | ICD-10-CM | POA: Diagnosis not present

## 2017-11-17 DIAGNOSIS — M9902 Segmental and somatic dysfunction of thoracic region: Secondary | ICD-10-CM | POA: Diagnosis not present

## 2017-11-20 ENCOUNTER — Other Ambulatory Visit: Payer: Self-pay | Admitting: Chiropractic Medicine

## 2017-11-20 DIAGNOSIS — M25511 Pain in right shoulder: Secondary | ICD-10-CM

## 2017-11-29 DIAGNOSIS — M9902 Segmental and somatic dysfunction of thoracic region: Secondary | ICD-10-CM | POA: Diagnosis not present

## 2017-11-29 DIAGNOSIS — M9901 Segmental and somatic dysfunction of cervical region: Secondary | ICD-10-CM | POA: Diagnosis not present

## 2017-11-29 DIAGNOSIS — M7541 Impingement syndrome of right shoulder: Secondary | ICD-10-CM | POA: Diagnosis not present

## 2017-11-30 DIAGNOSIS — Z23 Encounter for immunization: Secondary | ICD-10-CM | POA: Diagnosis not present

## 2017-11-30 DIAGNOSIS — G4733 Obstructive sleep apnea (adult) (pediatric): Secondary | ICD-10-CM | POA: Diagnosis not present

## 2017-11-30 DIAGNOSIS — I10 Essential (primary) hypertension: Secondary | ICD-10-CM | POA: Diagnosis not present

## 2017-11-30 DIAGNOSIS — H6123 Impacted cerumen, bilateral: Secondary | ICD-10-CM | POA: Diagnosis not present

## 2017-12-04 DIAGNOSIS — M9901 Segmental and somatic dysfunction of cervical region: Secondary | ICD-10-CM | POA: Diagnosis not present

## 2017-12-04 DIAGNOSIS — M9902 Segmental and somatic dysfunction of thoracic region: Secondary | ICD-10-CM | POA: Diagnosis not present

## 2017-12-04 DIAGNOSIS — M7541 Impingement syndrome of right shoulder: Secondary | ICD-10-CM | POA: Diagnosis not present

## 2017-12-09 ENCOUNTER — Ambulatory Visit
Admission: RE | Admit: 2017-12-09 | Discharge: 2017-12-09 | Disposition: A | Discharge status: Home / Self Care | Payer: 59 | Source: Ambulatory Visit | Attending: Chiropractic Medicine | Admitting: Chiropractic Medicine

## 2017-12-09 DIAGNOSIS — M25511 Pain in right shoulder: Secondary | ICD-10-CM | POA: Diagnosis not present

## 2017-12-14 DIAGNOSIS — M9902 Segmental and somatic dysfunction of thoracic region: Secondary | ICD-10-CM | POA: Diagnosis not present

## 2017-12-14 DIAGNOSIS — M9901 Segmental and somatic dysfunction of cervical region: Secondary | ICD-10-CM | POA: Diagnosis not present

## 2017-12-14 DIAGNOSIS — M7541 Impingement syndrome of right shoulder: Secondary | ICD-10-CM | POA: Diagnosis not present

## 2017-12-26 DIAGNOSIS — I10 Essential (primary) hypertension: Secondary | ICD-10-CM | POA: Diagnosis not present

## 2017-12-26 DIAGNOSIS — H524 Presbyopia: Secondary | ICD-10-CM | POA: Diagnosis not present

## 2018-01-05 DIAGNOSIS — M13811 Other specified arthritis, right shoulder: Secondary | ICD-10-CM | POA: Diagnosis not present

## 2018-01-05 DIAGNOSIS — M7541 Impingement syndrome of right shoulder: Secondary | ICD-10-CM | POA: Diagnosis not present

## 2018-01-12 ENCOUNTER — Encounter: Payer: Self-pay | Admitting: Neurology

## 2018-02-05 ENCOUNTER — Encounter: Payer: Self-pay | Admitting: Neurology

## 2018-02-05 ENCOUNTER — Ambulatory Visit (INDEPENDENT_AMBULATORY_CARE_PROVIDER_SITE_OTHER): Payer: 59 | Admitting: Neurology

## 2018-02-05 VITALS — BP 164/102 | HR 63 | Ht 68.5 in | Wt 195.0 lb

## 2018-02-05 DIAGNOSIS — G4733 Obstructive sleep apnea (adult) (pediatric): Secondary | ICD-10-CM

## 2018-02-05 DIAGNOSIS — Z789 Other specified health status: Secondary | ICD-10-CM

## 2018-02-05 NOTE — Patient Instructions (Addendum)
I am sorry, that you have not been able to use the CPAP.  I will refer you to dentistry, to Dr. Augustina Mood for consideration for an oral appliance.  Alternatively, you can consider seeing an ENT specialist for surgical eval for OSA or for the implantable device called Inspire.

## 2018-02-05 NOTE — Progress Notes (Signed)
Subjective:    Patient ID: Joshua Collins is a 47 y.o. male.  HPI    History:   Dear Joshua Collins,     I saw your patient, Joshua Collins, upon your kind request, in my neurologic clinic today for consultation of his sleep disorder, in particular, his prior Dx of obstructive sleep apnea. The patient is unaccompanied today. Of note, he no showed for an appointment on 09/11/2017. As you know, Joshua Collins is a 47 year old right-handed gentleman with an underlying medical history of allergies, reflux disease, hypertension with elevated blood pressure values at times as well as overweight state, who reports difficulty tolerating CPAP secondary to bloating/gas.   He has not been using CPAP, except for 3 days in the last 90 days.   He had a split night sleep study on 06/02/14, which showed an AHI of 21.3/hour and O2 nadir of 76% for the baseline portion of the study. On a treatment CPAP pressure of 9 cm, his AHI was 3.8/hour, and O2 nadir was 90%.   His Epworth sleepiness score is 8 out of 24 today, fatigue score is 23 out of 63. He has woken up with severe bloating and painful gas. He tried it more consistently in the beginning. He has never been able to be consistent on treatment. He has tried different masks and currently is using a full facemask, could not tolerate the nasal mask. He is interested in seeking alternative treatment. His blood pressure values have been creeping up and he was encouraged to seek treatment for his OSA. His weight has been stable.  The patient's allergies, current medications, family history, past medical history, past social history, past surgical history and problem list were reviewed and updated as appropriate.   Previously:   05/07/14: (He) reports snoring, witnessed apneas, waking up with a gasp at night and multiple nighttime awakenings as well as daytime tiredness. He reports a bedtime of 10 PM and usually falls asleep within 10-15 minutes. His rise time is 6 AM  and he feels marginally rested when he wakes up with a poor drinks. He denies morning headaches. He denies nocturia. He denies restless leg symptoms. His sleep is disrupted because of witnessed apneas and he also wakes himself up with his loud snoring at times and a sense of gasping for air. His Epworth sleepiness score is 8 out of a maximum of 24 today. He does not typically nap. He denies parasomnias. He drinks half a cup of coffee each day in the mornings. He is a nonsmoker and rarely drinks alcohol. He has had elevated blood pressure values recently. He has gained weight in the past couple of years. He recently moved from [place] in August 2015 and does endorse work related stress. He is the youngest of 77 children. One of his older brothers has obstructive sleep apnea and uses an oral appliance. He watches TV in bed and usually turns it off before falling asleep. He is not sure if he twitches in his sleep. He has 3 children, ages 31, 14 and 89. They have 2 pets, none of them in the bedroom.    His Past Medical History Is Significant For: Past Medical History:  Diagnosis Date  . Abnormal weight gain   . GERD (gastroesophageal reflux disease)   . Hypertension   . Impingement syndrome of right shoulder   . Kidney calculus   . OSA (obstructive sleep apnea)     His Past Surgical History Is Significant For: No past surgical history on  file.  His Family History Is Significant For: Family History  Problem Relation Age of Onset  . Seizures Mother   . Parkinson's disease Father   . Hypertension Brother   . Heart attack Maternal Uncle   . Colon polyps Brother   . Colon polyps Brother   . Colon cancer Neg Hx   . Esophageal cancer Neg Hx   . Stomach cancer Neg Hx   . Rectal cancer Neg Hx   . Liver cancer Neg Hx     His Social History Is Significant For: Social History   Socioeconomic History  . Marital status: Married    Spouse name: Joshua Collins  . Number of children: 3  . Years of  education: BSEE  . Highest education level: Not on file  Occupational History  . Occupation: Teacher, English as a foreign language    Comment: [employer]  Social Needs  . Financial resource strain: Not on file  . Food insecurity:    Worry: Not on file    Inability: Not on file  . Transportation needs:    Medical: Not on file    Non-medical: Not on file  Tobacco Use  . Smoking status: Never Smoker  . Smokeless tobacco: Never Used  Substance and Sexual Activity  . Alcohol use: No  . Drug use: No  . Sexual activity: Not on file  Lifestyle  . Physical activity:    Days per week: Not on file    Minutes per session: Not on file  . Stress: Not on file  Relationships  . Social connections:    Talks on phone: Not on file    Gets together: Not on file    Attends religious service: Not on file    Active member of club or organization: Not on file    Attends meetings of clubs or organizations: Not on file    Relationship status: Not on file  Other Topics Concern  . Not on file  Social History Narrative   Consumes 1 cup of caffeine daily    His Allergies Are:  Allergies  Allergen Reactions  . Seasonal Ic [Cholestatin]     Runny nose, itchy eyes  :   His Current Medications Are:  Outpatient Encounter Medications as of 02/05/2018  Medication Sig  . BYSTOLIC 10 MG tablet Take 10 mg by mouth daily.   . cetirizine (ZYRTEC) 10 MG tablet Take 10 mg by mouth daily.  Marland Kitchen LOSARTAN POTASSIUM PO Take 50 mg by mouth daily.   . [DISCONTINUED] loratadine (CLARITIN) 10 MG tablet Take 10 mg by mouth daily.   Facility-Administered Encounter Medications as of 02/05/2018  Medication  . 0.9 %  sodium chloride infusion  :  Review of Systems:  Out of a complete 14 point review of systems, all are reviewed and negative with the exception of these symptoms as listed below: Review of Systems  Neurological:       Pt presents today to discuss his cpap. Pt is concerned about the gas he experiences when using the  cpap. Pt does not want another sleep study.  Epworth Sleepiness Scale 0= would never doze 1= slight chance of dozing 2= moderate chance of dozing 3= high chance of dozing  Sitting and reading: 2 Watching TV: 3 Sitting inactive in a public place (ex. Theater or meeting): 0 As a passenger in a car for an hour without a break: 1 Lying down to rest in the afternoon: 1 Sitting and talking to someone: 1 Sitting quietly after lunch (  no alcohol): 0 In a car, while stopped in traffic: 0 Total: 8     Objective:  Neurological Exam  Physical Exam Physical Examination:   Vitals:   02/05/18 1532  BP: (!) 164/102  Pulse: 63   General Examination: The patient is a very pleasant 47 y.o. male in no acute distress. He appears well-developed and well-nourished and well groomed.   HEENT: Normocephalic, atraumatic, pupils are equal, round and reactive to light and accommodation. Extraocular tracking is good without limitation to gaze excursion or nystagmus noted. Normal smooth pursuit is noted. Hearing is grossly intact. Face is symmetric with normal facial animation and normal facial sensation. Speech is clear with no dysarthria noted. There is no hypophonia. There is no lip, neck/head, jaw or voice tremor. Neck shows FROM. Oropharynx exam reveals: mild mouth dryness, good dental hygiene and mild airway crowding, tonsils are absent. Mallampati is class II. He has a mild overbite.   Chest: Clear to auscultation without wheezing, rhonchi or crackles noted.  Heart: S1+S2+0, regular and normal without murmurs, rubs or gallops noted.   Abdomen: Soft, non-tender and non-distended with normal bowel sounds appreciated on auscultation.  Extremities: There is no pitting edema in the distal lower extremities bilaterally.   Skin: Warm and dry without trophic changes noted.   Musculoskeletal: exam reveals no obvious joint deformities, tenderness or joint swelling or erythema.   Neurologically:   Mental status: The patient is awake, alert and oriented in all 4 spheres. His immediate and remote memory, attention, language skills and fund of knowledge are appropriate. There is no evidence of aphasia, agnosia, apraxia or anomia. Speech is clear with normal prosody and enunciation. Thought process is linear. Mood is normal and affect is normal.  Cranial nerves II - XII are as described above under HEENT exam.  Motor exam: Normal bulk, strength and tone is noted. There is no tremor. Fine motor skills and coordination: grossly intact.  Cerebellar testing: No dysmetria or intention tremor. There is no truncal or gait ataxia.  Sensory exam: intact to light touch.  Gait, station and balance: He stands easily. No veering to one side is noted. No leaning to one side is noted. Posture is age-appropriate and stance is narrow based. Gait shows normal stride length and normal pace. No problems turning are noted.             Assessment and Plan:   In summary, Bond Burkle is a very pleasant 47 year old male with an underlying medical history of allergies, reflux disease, hypertension, as well as overweight state, who presents for follow-up consultation of his obstructive sleep apnea. He had a split-night sleep study in March 2016 which indicated moderate to severe sleep apnea with a baseline AHI of 21.3 per hour, REM AHI of 58.9 per hour, O2 nadir of 76% during supine REM sleep. He did fairly well with CPAP during the treatment portion of the study at the time but has been struggling with CPAP at home. He has not been able to use CPAP secondary to bloating and gas despite trying symptomatic treatment for gas as well as different masks. He is interested in pursuing other treatment options. I talked to him about surgical options versus dental option for treatment in the form of oral appliance. He would like to forego consultation with ENT. I talked to him about the inspire device as well, but he does not wish  to pursue ENT consultation at this time. He is open to pursuing a dental treatment  option. Of note, he wears a retainer at night. I placed a referral to dentistry and he is encouraged to call us if he does not hear from the dentist's office directly. He is advised that he may need an updated sleep study at some point. His study is nearly 48 years old. Nevertheless, for now, we will pursue consultation with dentistry and he will follow-up with me on an as-needed basis. I answered all his questions today and the patient was in agreement.  Thank you very much for allowing me to participate in the care of this patient. If I can be of any further assistance to you please do not hesitate to call me at [phone].  Sincerely,   Star Age, MD, PhD

## 2018-02-07 DIAGNOSIS — M9901 Segmental and somatic dysfunction of cervical region: Secondary | ICD-10-CM | POA: Diagnosis not present

## 2018-02-07 DIAGNOSIS — M7541 Impingement syndrome of right shoulder: Secondary | ICD-10-CM | POA: Diagnosis not present

## 2018-02-07 DIAGNOSIS — M9902 Segmental and somatic dysfunction of thoracic region: Secondary | ICD-10-CM | POA: Diagnosis not present

## 2018-02-09 DIAGNOSIS — M19011 Primary osteoarthritis, right shoulder: Secondary | ICD-10-CM | POA: Diagnosis not present

## 2018-02-09 DIAGNOSIS — M7541 Impingement syndrome of right shoulder: Secondary | ICD-10-CM | POA: Diagnosis not present

## 2018-02-19 DIAGNOSIS — I10 Essential (primary) hypertension: Secondary | ICD-10-CM | POA: Diagnosis not present

## 2018-02-19 DIAGNOSIS — Z6829 Body mass index (BMI) 29.0-29.9, adult: Secondary | ICD-10-CM | POA: Diagnosis not present

## 2018-03-07 DIAGNOSIS — J069 Acute upper respiratory infection, unspecified: Secondary | ICD-10-CM | POA: Diagnosis not present

## 2018-03-07 DIAGNOSIS — I10 Essential (primary) hypertension: Secondary | ICD-10-CM | POA: Diagnosis not present

## 2018-03-07 DIAGNOSIS — H6123 Impacted cerumen, bilateral: Secondary | ICD-10-CM | POA: Diagnosis not present

## 2018-03-07 DIAGNOSIS — K219 Gastro-esophageal reflux disease without esophagitis: Secondary | ICD-10-CM | POA: Diagnosis not present

## 2018-03-15 DIAGNOSIS — K120 Recurrent oral aphthae: Secondary | ICD-10-CM | POA: Diagnosis not present

## 2018-03-15 DIAGNOSIS — J069 Acute upper respiratory infection, unspecified: Secondary | ICD-10-CM | POA: Diagnosis not present

## 2018-03-15 DIAGNOSIS — Z6829 Body mass index (BMI) 29.0-29.9, adult: Secondary | ICD-10-CM | POA: Diagnosis not present

## 2018-04-02 DIAGNOSIS — Z6830 Body mass index (BMI) 30.0-30.9, adult: Secondary | ICD-10-CM | POA: Diagnosis not present

## 2018-04-02 DIAGNOSIS — H6691 Otitis media, unspecified, right ear: Secondary | ICD-10-CM | POA: Diagnosis not present

## 2018-04-02 DIAGNOSIS — M9902 Segmental and somatic dysfunction of thoracic region: Secondary | ICD-10-CM | POA: Diagnosis not present

## 2018-04-02 DIAGNOSIS — M7541 Impingement syndrome of right shoulder: Secondary | ICD-10-CM | POA: Diagnosis not present

## 2018-04-02 DIAGNOSIS — R05 Cough: Secondary | ICD-10-CM | POA: Diagnosis not present

## 2018-04-02 DIAGNOSIS — M9901 Segmental and somatic dysfunction of cervical region: Secondary | ICD-10-CM | POA: Diagnosis not present

## 2018-04-09 DIAGNOSIS — M7541 Impingement syndrome of right shoulder: Secondary | ICD-10-CM | POA: Diagnosis not present

## 2018-04-09 DIAGNOSIS — M9901 Segmental and somatic dysfunction of cervical region: Secondary | ICD-10-CM | POA: Diagnosis not present

## 2018-04-09 DIAGNOSIS — M9902 Segmental and somatic dysfunction of thoracic region: Secondary | ICD-10-CM | POA: Diagnosis not present

## 2018-04-10 DIAGNOSIS — J069 Acute upper respiratory infection, unspecified: Secondary | ICD-10-CM | POA: Diagnosis not present

## 2018-04-10 DIAGNOSIS — R05 Cough: Secondary | ICD-10-CM | POA: Diagnosis not present

## 2018-04-10 DIAGNOSIS — I10 Essential (primary) hypertension: Secondary | ICD-10-CM | POA: Diagnosis not present

## 2018-04-24 DIAGNOSIS — I10 Essential (primary) hypertension: Secondary | ICD-10-CM | POA: Diagnosis not present

## 2018-04-24 DIAGNOSIS — R05 Cough: Secondary | ICD-10-CM | POA: Diagnosis not present

## 2018-04-24 DIAGNOSIS — Z6830 Body mass index (BMI) 30.0-30.9, adult: Secondary | ICD-10-CM | POA: Diagnosis not present

## 2018-04-26 ENCOUNTER — Other Ambulatory Visit: Payer: Self-pay | Admitting: Family Medicine

## 2018-04-26 ENCOUNTER — Ambulatory Visit
Admission: RE | Admit: 2018-04-26 | Discharge: 2018-04-26 | Disposition: A | Discharge status: Home / Self Care | Payer: 59 | Source: Ambulatory Visit | Attending: Family Medicine | Admitting: Family Medicine

## 2018-04-26 DIAGNOSIS — R059 Cough, unspecified: Secondary | ICD-10-CM

## 2018-04-26 DIAGNOSIS — R05 Cough: Secondary | ICD-10-CM

## 2018-04-30 DIAGNOSIS — M7541 Impingement syndrome of right shoulder: Secondary | ICD-10-CM | POA: Diagnosis not present

## 2018-04-30 DIAGNOSIS — M25511 Pain in right shoulder: Secondary | ICD-10-CM | POA: Diagnosis not present

## 2018-05-10 ENCOUNTER — Telehealth: Payer: Self-pay

## 2018-05-10 DIAGNOSIS — Z9989 Dependence on other enabling machines and devices: Principal | ICD-10-CM

## 2018-05-10 DIAGNOSIS — G4733 Obstructive sleep apnea (adult) (pediatric): Secondary | ICD-10-CM

## 2018-05-10 NOTE — Telephone Encounter (Signed)
Order received from Dr. Rexene Alberts. Order sent to [facility].

## 2018-05-10 NOTE — Telephone Encounter (Signed)
Received this notice from [facility]: " I just got off the phone with him and he is wanting a new mask and supplies. I don't have a good order on file for him and will need that before i can get that for him. We have not givin him anything since 2017. Can you help me with this please. Thank you!"  I called pt. He was supposed to see Dr. Toy Cookey last week regarding an oral appliance but due to the weather it was rescheduled. However, he is wanting to try a new mask for his cpap to see if he can tolerate a new mask and if so, he will stick with his cpap. (Pt's friend recommended a mask." I advised him that I will ask Dr. Rexene Alberts for an order and send it to [facility]. Pt verbalized understanding.

## 2018-05-25 DIAGNOSIS — M7541 Impingement syndrome of right shoulder: Secondary | ICD-10-CM | POA: Diagnosis not present

## 2018-05-25 DIAGNOSIS — Z0000 Encounter for general adult medical examination without abnormal findings: Secondary | ICD-10-CM | POA: Diagnosis not present

## 2018-05-25 DIAGNOSIS — I10 Essential (primary) hypertension: Secondary | ICD-10-CM | POA: Diagnosis not present

## 2018-05-25 DIAGNOSIS — R05 Cough: Secondary | ICD-10-CM | POA: Diagnosis not present

## 2018-05-25 DIAGNOSIS — Z125 Encounter for screening for malignant neoplasm of prostate: Secondary | ICD-10-CM | POA: Diagnosis not present

## 2018-05-28 DIAGNOSIS — Z0000 Encounter for general adult medical examination without abnormal findings: Secondary | ICD-10-CM | POA: Diagnosis not present

## 2018-05-28 DIAGNOSIS — Z6829 Body mass index (BMI) 29.0-29.9, adult: Secondary | ICD-10-CM | POA: Diagnosis not present

## 2018-06-13 DIAGNOSIS — K644 Residual hemorrhoidal skin tags: Secondary | ICD-10-CM | POA: Diagnosis not present

## 2018-06-13 DIAGNOSIS — R21 Rash and other nonspecific skin eruption: Secondary | ICD-10-CM | POA: Diagnosis not present

## 2018-06-13 DIAGNOSIS — K5900 Constipation, unspecified: Secondary | ICD-10-CM | POA: Diagnosis not present

## 2018-06-14 DIAGNOSIS — R21 Rash and other nonspecific skin eruption: Secondary | ICD-10-CM | POA: Diagnosis not present

## 2018-06-14 DIAGNOSIS — L03818 Cellulitis of other sites: Secondary | ICD-10-CM | POA: Diagnosis not present

## 2018-06-14 DIAGNOSIS — K644 Residual hemorrhoidal skin tags: Secondary | ICD-10-CM | POA: Diagnosis not present

## 2018-06-18 DIAGNOSIS — R21 Rash and other nonspecific skin eruption: Secondary | ICD-10-CM | POA: Diagnosis not present

## 2018-11-27 ENCOUNTER — Other Ambulatory Visit: Payer: Self-pay | Admitting: Family Medicine

## 2018-11-27 DIAGNOSIS — I10 Essential (primary) hypertension: Secondary | ICD-10-CM

## 2018-12-06 ENCOUNTER — Ambulatory Visit
Admission: RE | Admit: 2018-12-06 | Discharge: 2018-12-06 | Disposition: A | Discharge status: Home / Self Care | Payer: 59 | Source: Ambulatory Visit | Attending: Family Medicine | Admitting: Family Medicine

## 2018-12-06 DIAGNOSIS — I10 Essential (primary) hypertension: Secondary | ICD-10-CM

## 2019-01-24 ENCOUNTER — Encounter: Payer: Self-pay | Admitting: Gastroenterology

## 2019-02-20 ENCOUNTER — Other Ambulatory Visit: Payer: Self-pay

## 2019-02-20 DIAGNOSIS — Z20822 Contact with and (suspected) exposure to covid-19: Secondary | ICD-10-CM

## 2019-02-21 LAB — NOVEL CORONAVIRUS, NAA: SARS-CoV-2, NAA: NOT DETECTED

## 2019-03-04 ENCOUNTER — Emergency Department (HOSPITAL_COMMUNITY): Payer: 59

## 2019-03-04 ENCOUNTER — Other Ambulatory Visit: Payer: Self-pay

## 2019-03-04 ENCOUNTER — Encounter (HOSPITAL_COMMUNITY): Payer: Self-pay | Admitting: Emergency Medicine

## 2019-03-04 ENCOUNTER — Emergency Department (HOSPITAL_COMMUNITY)
Admission: EM | Admit: 2019-03-04 | Discharge: 2019-03-04 | Disposition: A | Discharge status: Home / Self Care | Payer: 59 | Attending: Emergency Medicine | Admitting: Emergency Medicine

## 2019-03-04 DIAGNOSIS — N201 Calculus of ureter: Secondary | ICD-10-CM | POA: Diagnosis not present

## 2019-03-04 DIAGNOSIS — Z79899 Other long term (current) drug therapy: Secondary | ICD-10-CM | POA: Diagnosis not present

## 2019-03-04 DIAGNOSIS — N200 Calculus of kidney: Secondary | ICD-10-CM

## 2019-03-04 DIAGNOSIS — I10 Essential (primary) hypertension: Secondary | ICD-10-CM | POA: Diagnosis not present

## 2019-03-04 DIAGNOSIS — R1031 Right lower quadrant pain: Secondary | ICD-10-CM | POA: Diagnosis present

## 2019-03-04 LAB — URINALYSIS, ROUTINE W REFLEX MICROSCOPIC
Bacteria, UA: NONE SEEN
Bilirubin Urine: NEGATIVE
Glucose, UA: NEGATIVE mg/dL
Ketones, ur: NEGATIVE mg/dL
Leukocytes,Ua: NEGATIVE
Nitrite: NEGATIVE
Protein, ur: NEGATIVE mg/dL
RBC / HPF: >50 RBC/hpf — ABNORMAL HIGH (ref 0–5)
Specific Gravity, Urine: 1.014 (ref 1.005–1.030)
pH: 7 (ref 5.0–8.0)

## 2019-03-04 LAB — BASIC METABOLIC PANEL
Anion gap: 9 (ref 5–15)
BUN: 16 mg/dL (ref 6–20)
CO2: 30 mmol/L (ref 22–32)
Calcium: 9.2 mg/dL (ref 8.9–10.3)
Chloride: 100 mmol/L (ref 98–111)
Creatinine, Ser: 1.21 mg/dL (ref 0.61–1.24)
GFR calc Af Amer: >60 mL/min (ref >60)
GFR calc non Af Amer: >60 mL/min (ref >60)
Glucose, Bld: 110 mg/dL — ABNORMAL HIGH (ref 70–99)
Potassium: 3.3 mmol/L — ABNORMAL LOW (ref 3.5–5.1)
Sodium: 139 mmol/L (ref 135–145)

## 2019-03-04 LAB — CBC
HCT: 43.5 % (ref 39.0–52.0)
Hemoglobin: 14.9 g/dL (ref 13.0–17.0)
MCH: 29.6 pg (ref 26.0–34.0)
MCHC: 34.3 g/dL (ref 30.0–36.0)
MCV: 86.3 fL (ref 80.0–100.0)
Platelets: 273 10*3/uL (ref 150–400)
RBC: 5.04 MIL/uL (ref 4.22–5.81)
RDW: 12.9 % (ref 11.5–15.5)
WBC: 6.9 10*3/uL (ref 4.0–10.5)
nRBC: 0 % (ref 0.0–0.2)

## 2019-03-04 MED ORDER — KETOROLAC TROMETHAMINE 30 MG/ML IJ SOLN
30.0000 mg | Freq: Once | INTRAMUSCULAR | Status: AC
  Administered 2019-03-04: 20:00:00 30 mg via INTRAVENOUS
  Filled 2019-03-04: qty 1

## 2019-03-04 MED ORDER — PROMETHAZINE HCL 25 MG PO TABS
25.0000 mg | ORAL_TABLET | Freq: Once | ORAL | Status: AC
  Administered 2019-03-04: 22:00:00 25 mg via ORAL
  Filled 2019-03-04: qty 1

## 2019-03-04 MED ORDER — ONDANSETRON 4 MG PO TBDP
4.0000 mg | ORAL_TABLET | Freq: Once | ORAL | Status: AC
  Administered 2019-03-04: 4 mg via ORAL
  Filled 2019-03-04: qty 1

## 2019-03-04 MED ORDER — KETOROLAC TROMETHAMINE 10 MG PO TABS: 10.0000 mg | ORAL_TABLET | Freq: Four times a day (QID) | ORAL | 0 refills | Status: DC | PRN

## 2019-03-04 MED ORDER — OXYCODONE-ACETAMINOPHEN 5-325 MG PO TABS
2.0000 | ORAL_TABLET | Freq: Once | ORAL | Status: AC
  Administered 2019-03-04: 16:00:00 2 via ORAL
  Filled 2019-03-04: qty 2

## 2019-03-04 MED ORDER — TAMSULOSIN HCL 0.4 MG PO CAPS: 0.4000 mg | ORAL_CAPSULE | Freq: Every day | ORAL | 0 refills | Status: DC

## 2019-03-04 MED ORDER — HYDROMORPHONE HCL 1 MG/ML IJ SOLN
1.0000 mg | Freq: Once | INTRAMUSCULAR | Status: AC
  Administered 2019-03-04: 19:00:00 1 mg via INTRAMUSCULAR
  Filled 2019-03-04: qty 1

## 2019-03-04 MED ORDER — ONDANSETRON HCL 4 MG PO TABS: 4.0000 mg | ORAL_TABLET | Freq: Three times a day (TID) | ORAL | 0 refills | Status: DC | PRN

## 2019-03-04 NOTE — ED Provider Notes (Signed)
[facility] Provider Note   CSN: [id number] Arrival date & time: 03/04/19  1525     History   Chief Complaint Chief Complaint  Patient presents with   Flank Pain    HPI Joshua Collins is a 48 y.o. male.     The history is provided by the patient. No language interpreter was used.  Flank Pain     48 year old male with remote history of kidney stones presenting with acute onset of right flank pain.  Patient report acute onset of right flank pain that started approximately 2 to 3 hours ago.  Pain is described as sharp stabbing persistent, nothing seems to make it better or worse.  He endorsed feeling nauseous and states pain felt very similar to prior kidney stones that he had approximately 5 years ago that he was able to pass on his own.  He denies any recent injury, denies fever or chills dysuria or hematuria.  Initially he received 2 Percocets as well as antinausea medication but report it has not provided any significant relief.  Currently rates pain as 10 out of 10.  Pain is nonradiating.  Past Medical History:  Diagnosis Date   Abnormal weight gain    GERD (gastroesophageal reflux disease)    Hypertension    Impingement syndrome of right shoulder    Kidney calculus    OSA (obstructive sleep apnea)     There are no active problems to display for this patient.   History reviewed. No pertinent surgical history.      Home Medications    Prior to Admission medications   Medication Sig Start Date End Date Taking? Authorizing Provider  BYSTOLIC 10 MG tablet Take 10 mg by mouth daily.  12/25/13   [provider]  cetirizine (ZYRTEC) 10 MG tablet Take 10 mg by mouth daily.    [provider]  LOSARTAN POTASSIUM PO Take 50 mg by mouth daily.     [provider]    Family History Family History  Problem Relation Age of Onset   Seizures Mother    Parkinson's disease Father    Hypertension  Brother    Heart attack Maternal Uncle    Colon polyps Brother    Colon polyps Brother    Colon cancer Neg Hx    Esophageal cancer Neg Hx    Stomach cancer Neg Hx    Rectal cancer Neg Hx    Liver cancer Neg Hx     Social History Social History   Tobacco Use   Smoking status: Never Smoker   Smokeless tobacco: Never Used  Substance Use Topics   Alcohol use: No   Drug use: No     Allergies   Seasonal ic [cholestatin]   Review of Systems Review of Systems  Genitourinary: Positive for flank pain.  All other systems reviewed and are negative.    Physical Exam Updated Vital Signs BP (!) 130/110 (BP Location: Left Arm)    Pulse 72    Temp 98.5 F (36.9 C) (Oral)    Resp 20    Ht 5\' 8"  (1.727 m)    Wt 88.5 kg    SpO2 99%    BMI 29.65 kg/m   Physical Exam Vitals signs and nursing note reviewed.  Constitutional:      Appearance: He is well-developed.     Comments: Appears uncomfortable, writhing in bed  HENT:     Head: Atraumatic.  Eyes:     Conjunctiva/sclera: Conjunctivae  normal.  Neck:     Musculoskeletal: Neck supple.  Cardiovascular:     Rate and Rhythm: Normal rate and regular rhythm.     Heart sounds: Normal heart sounds.  Pulmonary:     Breath sounds: Normal breath sounds.  Abdominal:     Palpations: Abdomen is soft.     Tenderness: There is no abdominal tenderness. There is right CVA tenderness. There is no left CVA tenderness.  Skin:    Findings: No rash.  Neurological:     Mental Status: He is alert.  Psychiatric:        Mood and Affect: Mood normal.      ED Treatments / Results  Labs (all labs ordered are listed, but only abnormal results are displayed) Labs Reviewed  BASIC METABOLIC PANEL - Abnormal; Notable for the following components:      Result Value   Potassium 3.3 (*)    Glucose, Bld 110 (*)    All other components within normal limits  URINALYSIS, ROUTINE W REFLEX MICROSCOPIC - Abnormal; Notable for the following  components:   Hgb urine dipstick LARGE (*)    RBC / HPF >50 (*)    All other components within normal limits  CBC    EKG None  Radiology Ct Renal Stone Study  Result Date: 03/04/2019 CLINICAL DATA:  Right flank pain for 2 hours. EXAM: CT ABDOMEN AND PELVIS WITHOUT CONTRAST TECHNIQUE: Multidetector CT imaging of the abdomen and pelvis was performed following the standard protocol without IV contrast. COMPARISON:  None. FINDINGS: Lower chest: The lung bases are clear of acute process. No pleural effusion or pulmonary lesions. The heart is normal in size. No pericardial effusion. The distal esophagus and aorta are unremarkable. Hepatobiliary: No focal hepatic lesions or intrahepatic biliary dilatation. The gallbladder is normal. No common bile duct dilatation. Pancreas: No mass, inflammation or ductal dilatation. Spleen: Normal size. No focal lesions. Adrenals/Urinary Tract: The adrenal glands and left kidney are unremarkable. No left-sided renal or ureteral calculi. The right kidney is mildly edematous and there is moderate right-sided hydroureteronephrosis down to 2 adjacent obstructing calculi just below the UPJ in the upper right ureter at the L3 level. The slightly larger more proximal calculus measures 4.5 mm and the smaller more distal calculus measures 2.5 mm. No distal ureteral calculi or bladder calculi. Stomach/Bowel: The stomach, duodenum, small bowel and colon are grossly normal without oral contrast. No inflammatory changes, mass lesions or obstructive findings. The terminal ileum and appendix are normal. Vascular/Lymphatic: The aorta is normal in caliber. No atheroscerlotic calcifications. No mesenteric of retroperitoneal mass or adenopathy. Small scattered lymph nodes are noted. Reproductive: The prostate gland and seminal vesicles are unremarkable. Other: No pelvic mass or adenopathy. No free pelvic fluid collections. No inguinal mass or adenopathy. No abdominal wall hernia or subcutaneous  lesions. Musculoskeletal: No significant bony findings. IMPRESSION: 1. Two adjacent upper right ureteral calculi causing moderate grade obstruction. 2. No renal or bladder calculi. Electronically Signed   By: Marijo Sanes M.D.   On: 03/04/2019 18:02    Procedures Procedures (including critical care time)  Medications Ordered in ED Medications  ondansetron (ZOFRAN-ODT) disintegrating tablet 4 mg (4 mg Oral Given 03/04/19 1548)  oxyCODONE-acetaminophen (PERCOCET/ROXICET) 5-325 MG per tablet 2 tablet (2 tablets Oral Given 03/04/19 1548)  HYDROmorphone (DILAUDID) injection 1 mg (1 mg Intramuscular Given 03/04/19 1854)  ondansetron (ZOFRAN-ODT) disintegrating tablet 4 mg (4 mg Oral Given 03/04/19 1859)  ketorolac (TORADOL) 30 MG/ML injection 30 mg (30 mg Intravenous  Given 03/04/19 1933)  promethazine (PHENERGAN) tablet 25 mg (25 mg Oral Given 03/04/19 2203)     Initial Impression / Assessment and Plan / ED Course  I have reviewed the triage vital signs and the nursing notes.  Pertinent labs & imaging results that were available during my care of the patient were reviewed by me and considered in my medical decision making (see chart for details).        BP (!) 157/109    Pulse 65    Temp 98.5 F (36.9 C) (Oral)    Resp (!) 22    Ht 5\' 8"  (1.727 m)    Wt 88.5 kg    SpO2 100%    BMI 29.65 kg/m    Final Clinical Impressions(s) / ED Diagnoses   Final diagnoses:  Kidney stone    ED Discharge Orders         Ordered    ketorolac (TORADOL) 10 MG tablet  Every 6 hours PRN     03/04/19 2305    tamsulosin (FLOMAX) 0.4 MG CAPS capsule  Daily     03/04/19 2305    ondansetron (ZOFRAN) 4 MG tablet  Every 8 hours PRN     03/04/19 2305         6:50 PM Patient here with right flank pain concerning for kidney stone.  Remote history of kidney stones in the past.  He has not been able to produce urine.  Kidney function within normal limit.  Pain medication given.  CT scan currently pending.  7:20  PM Patient report minimal improvement after receiving 2 Percocet.  He received an additional 1 mg of IM Dilaudid however the pain still remains intense.  We will give Toradol 30 mg IV for better pain management.  Additional antinausea medication given.  8:54 PM At this time, results of the CT renal stone study have not become available yet.  I have reached out and talk to radiology, who report that patient has 2 obstructive stone on the right ureter.  A proximal stone of 4.5 mm as well as a distal stone of 2.5 mm with moderate grade obstruction.  At this time, patient have not healed a urinalysis however his pain has shown some improvement with treatment.  We will continue to monitor.  11:02 PM UA without signs of urinary tract infection.  Mild hypokalemia with a potassium of 3.3.  CT scan finally resulted showing 2 adjacent upper right ureteral calculi causing moderate grade obstruction.  At this time patient's pain is well controlled and he is stable for discharge.  Encourage patient to follow-up with urologist for further care.  Will provide symptomatic treatment, return precaution discussed.   Domenic Moras, PA-C 03/04/19 2307    Quintella Reichert, MD 03/05/19 1425

## 2019-03-04 NOTE — ED Notes (Addendum)
Discharge instructions and prescriptions discussed with Pt. Pt verbalized understanding. Pt stable and ambulatory.   

## 2019-03-04 NOTE — ED Notes (Signed)
Pt stated he vomited in the bathroom. This RN offered crackers and ginger ale.

## 2019-03-04 NOTE — Discharge Instructions (Addendum)
You have been diagnosed with kidney stone on the right side.  You have two stones, one measuring 4.5 mm and another one measuring 2.5 mm.  I anticipate you will be able to pass the stones within the next several days.  Take medication prescribed.  Follow-up with urology for further evaluation.  Return if you have any concern.

## 2019-03-04 NOTE — ED Provider Notes (Signed)
MSE was initiated and I personally evaluated the patient and placed orders (if any) at  3:42 PM on March 04, 2019.  The patient appears stable so that the remainder of the MSE may be completed by another provider.  Patient is a otherwise healthy 48 year old male who presents today for evaluation of right-sided flank pain.  He reports that approximately 30 to 60 minutes prior to arrival he had sudden onset of right-sided flank pain.  He states that he has had kidney stones in the past however the last one was more than 10 years ago.  He denies any dysuria, increased frequency or urgency.  He denies any recent trauma.  He states that this feels like his previous kidney stones.  He denies any allergies to medications.  On initial exam he appeared uncomfortable, was pacing in the room nauseous and ready to vomit from pain.  Zofran and p.o. pain medications were ordered from triage given severity of symptoms.  As he has not had a CAT scan or kidney stones now for 5 years CT renal stone study was ordered.  Basic labs ordered.  We discussed the importance of staying for the remainder of the evaluation along with the possible consequences of failing to do so and he states his understanding.   Joshua Collins 03/04/19 1550    Carmin Muskrat, MD 03/06/19 [phone]

## 2019-03-04 NOTE — ED Triage Notes (Signed)
Pt to ED with c/o sudden onset of right flank pain radiating around to testicle   Onset approx 30 mins ago.  Pt st's he has had kidney stones before and this feels the same

## 2019-03-07 ENCOUNTER — Other Ambulatory Visit (HOSPITAL_COMMUNITY)
Admission: RE | Admit: 2019-03-07 | Discharge: 2019-03-07 | Disposition: A | Discharge status: Home / Self Care | Payer: 59 | Source: Ambulatory Visit | Attending: Urology | Admitting: Urology

## 2019-03-07 ENCOUNTER — Other Ambulatory Visit: Payer: Self-pay | Admitting: Urology

## 2019-03-07 DIAGNOSIS — Z01812 Encounter for preprocedural laboratory examination: Secondary | ICD-10-CM | POA: Insufficient documentation

## 2019-03-07 DIAGNOSIS — Z20828 Contact with and (suspected) exposure to other viral communicable diseases: Secondary | ICD-10-CM | POA: Insufficient documentation

## 2019-03-08 ENCOUNTER — Encounter (HOSPITAL_BASED_OUTPATIENT_CLINIC_OR_DEPARTMENT_OTHER): Payer: Self-pay | Admitting: Urology

## 2019-03-08 ENCOUNTER — Encounter (HOSPITAL_BASED_OUTPATIENT_CLINIC_OR_DEPARTMENT_OTHER): Payer: Self-pay | Admitting: Anesthesiology

## 2019-03-08 LAB — NOVEL CORONAVIRUS, NAA (HOSP ORDER, SEND-OUT TO REF LAB; TAT 18-24 HRS): SARS-CoV-2, NAA: NOT DETECTED

## 2019-03-11 ENCOUNTER — Ambulatory Visit (HOSPITAL_BASED_OUTPATIENT_CLINIC_OR_DEPARTMENT_OTHER)
Admission: RE | Admit: 2019-03-11 | Discharge: 2019-03-11 | Disposition: A | Discharge status: Home / Self Care | Payer: 59 | Attending: Urology | Admitting: Urology

## 2019-03-11 ENCOUNTER — Encounter (HOSPITAL_BASED_OUTPATIENT_CLINIC_OR_DEPARTMENT_OTHER): Payer: Self-pay | Admitting: Urology

## 2019-03-11 ENCOUNTER — Other Ambulatory Visit: Payer: Self-pay

## 2019-03-11 ENCOUNTER — Ambulatory Visit (HOSPITAL_COMMUNITY): Payer: 59

## 2019-03-11 ENCOUNTER — Encounter (HOSPITAL_BASED_OUTPATIENT_CLINIC_OR_DEPARTMENT_OTHER)
Admission: RE | Disposition: A | Discharge status: Home / Self Care | Payer: Self-pay | Source: Home / Self Care | Attending: Urology

## 2019-03-11 DIAGNOSIS — Z9852 Vasectomy status: Secondary | ICD-10-CM | POA: Diagnosis not present

## 2019-03-11 DIAGNOSIS — N201 Calculus of ureter: Secondary | ICD-10-CM | POA: Diagnosis not present

## 2019-03-11 HISTORY — DX: Nausea with vomiting, unspecified: R11.2

## 2019-03-11 HISTORY — PX: EXTRACORPOREAL SHOCK WAVE LITHOTRIPSY: SHX1557

## 2019-03-11 HISTORY — DX: Other specified postprocedural states: Z98.890

## 2019-03-11 HISTORY — DX: Personal history of urinary calculi: Z87.442

## 2019-03-11 SURGERY — LITHOTRIPSY, ESWL
Anesthesia: LOCAL | Laterality: Right

## 2019-03-11 MED ORDER — DIPHENHYDRAMINE HCL 25 MG PO CAPS
ORAL_CAPSULE | ORAL | Status: AC
  Filled 2019-03-11: qty 1

## 2019-03-11 MED ORDER — DIAZEPAM 5 MG PO TABS
ORAL_TABLET | ORAL | Status: AC
  Filled 2019-03-11: qty 1

## 2019-03-11 MED ORDER — DIPHENHYDRAMINE HCL 25 MG PO CAPS
25.0000 mg | ORAL_CAPSULE | ORAL | Status: AC
  Administered 2019-03-11: 25 mg via ORAL
  Filled 2019-03-11: qty 1

## 2019-03-11 MED ORDER — TRAMADOL HCL 50 MG PO TABS: 50.0000 mg | ORAL_TABLET | Freq: Four times a day (QID) | ORAL | 0 refills | Status: DC | PRN

## 2019-03-11 MED ORDER — CIPROFLOXACIN HCL 500 MG PO TABS
500.0000 mg | ORAL_TABLET | ORAL | Status: AC
  Administered 2019-03-11: 06:00:00 500 mg via ORAL
  Filled 2019-03-11: qty 1

## 2019-03-11 MED ORDER — SODIUM CHLORIDE 0.9 % IV SOLN
INTRAVENOUS | Status: DC
  Administered 2019-03-11: 07:00:00 via INTRAVENOUS
  Filled 2019-03-11: qty 1000

## 2019-03-11 MED ORDER — CIPROFLOXACIN HCL 500 MG PO TABS
ORAL_TABLET | ORAL | Status: AC
  Filled 2019-03-11: qty 1

## 2019-03-11 MED ORDER — DIAZEPAM 5 MG PO TABS
ORAL_TABLET | ORAL | Status: AC
  Filled 2019-03-11: qty 2

## 2019-03-11 MED ORDER — DIAZEPAM 5 MG PO TABS
10.0000 mg | ORAL_TABLET | ORAL | Status: AC
  Administered 2019-03-11: 06:00:00 10 mg via ORAL
  Filled 2019-03-11: qty 2

## 2019-03-11 NOTE — H&P (Signed)
See H& P.

## 2019-03-11 NOTE — Discharge Instructions (Signed)
Post Anesthesia Home Care Instructions  Activity: Get plenty of rest for the remainder of the day. A responsible adult should stay with you for 24 hours following the procedure.  For the next 24 hours, DO NOT: -Drive a car -Paediatric nurse -Drink alcoholic beverages -Take any medication unless instructed by your physician -Make any legal decisions or sign important papers.  Meals: Start with liquid foods such as gelatin or soup. Progress to regular foods as tolerated. Avoid greasy, spicy, heavy foods. If nausea and/or vomiting occur, drink only clear liquids until the nausea and/or vomiting subsides. Call your physician if vomiting continues.  Special Instructions/Symptoms: Your throat may feel dry or sore from the anesthesia or the breathing tube placed in your throat during surgery. If this causes discomfort, gargle with warm salt water. The discomfort should disappear within 24 hours.  If you had a scopolamine patch placed behind your ear for the management of post- operative nausea and/or vomiting:  1. The medication in the patch is effective for 72 hours, after which it should be removed.  Wrap patch in a tissue and discard in the trash. Wash hands thoroughly with soap and water. 2. You may remove the patch earlier than 72 hours if you experience unpleasant side effects which may include dry mouth, dizziness or visual disturbances. 3. Avoid touching the patch. Wash your hands with soap and water after contact with the patch.   Lithotripsy, Care After This sheet gives you information about how to care for yourself after your procedure. Your health care provider may also give you more specific instructions. If you have problems or questions, contact your health care provider. What can I expect after the procedure? After the procedure, it is common to have:  Some blood in your urine. This should only last for a few days.  Soreness in your back, sides, or upper abdomen for a few  days.  Blotches or bruises on your back where the pressure wave entered the skin.  Pain, discomfort, or nausea when pieces (fragments) of the kidney stone move through the tube that carries urine from the kidney to the bladder (ureter). Stone fragments may pass soon after the procedure, but they may continue to pass for up to 4-8 weeks. ? If you have severe pain or nausea, contact your health care provider. This may be caused by a large stone that was not broken up, and this may mean that you need more treatment.  Some pain or discomfort during urination.  Some pain or discomfort in the lower abdomen or (in men) at the base of the penis. Follow these instructions at home: Medicines  Take over-the-counter and prescription medicines only as told by your health care provider.  If you were prescribed an antibiotic medicine, take it as told by your health care provider. Do not stop taking the antibiotic even if you start to feel better.  Do not drive for 24 hours if you were given a medicine to help you relax (sedative).  Do not drive or use heavy machinery while taking prescription pain medicine. Eating and drinking      Drink enough water and fluids to keep your urine clear or pale yellow. This helps any remaining pieces of the stone to pass. It can also help prevent new stones from forming.  Eat plenty of fresh fruits and vegetables.  Follow instructions from your health care provider about eating and drinking restrictions. You may be instructed: ? To reduce how much salt (sodium) you eat  or drink. Check ingredients and nutrition facts on packaged foods and beverages. ? To reduce how much meat you eat.  Eat the recommended amount of calcium for your age and gender. Ask your health care provider how much calcium you should have. General instructions  Get plenty of rest.  Most people can resume normal activities 1-2 days after the procedure. Ask your health care provider what  activities are safe for you.  Your health care provider may direct you to lie in a certain position (postural drainage) and tap firmly (percuss) over your kidney area to help stone fragments pass. Follow instructions as told by your health care provider.  If directed, strain all urine through the strainer that was provided by your health care provider. ? Keep all fragments for your health care provider to see. Any stones that are found may be sent to a medical lab for examination. The stone may be as small as a grain of salt.  Keep all follow-up visits as told by your health care provider. This is important. Contact a health care provider if:  You have pain that is severe or does not get better with medicine.  You have nausea that is severe or does not go away.  You have blood in your urine longer than your health care provider told you to expect.  You have more blood in your urine.  You have pain during urination that does not go away.  You urinate more frequently than usual and this does not go away.  You develop a rash or any other possible signs of an allergic reaction. Get help right away if:  You have severe pain in your back, sides, or upper abdomen.  You have severe pain while urinating.  Your urine is very dark red.  You have blood in your stool (feces).  You cannot pass any urine at all.  You feel a strong urge to urinate after emptying your bladder.  You have a fever or chills.  You develop shortness of breath, difficulty breathing, or chest pain.  You have severe nausea that leads to persistent vomiting.  You faint. Summary  After this procedure, it is common to have some pain, discomfort, or nausea when pieces (fragments) of the kidney stone move through the tube that carries urine from the kidney to the bladder (ureter). If this pain or nausea is severe, however, you should contact your health care provider.  Most people can resume normal activities 1-2  days after the procedure. Ask your health care provider what activities are safe for you.  Drink enough water and fluids to keep your urine clear or pale yellow. This helps any remaining pieces of the stone to pass, and it can help prevent new stones from forming.  If directed, strain your urine and keep all fragments for your health care provider to see. Fragments or stones may be as small as a grain of salt.  Get help right away if you have severe pain in your back, sides, or upper abdomen or have severe pain while urinating. This information is not intended to replace advice given to you by your health care provider. Make sure you discuss any questions you have with your health care provider. Document Released: 04/03/2007 Document Revised: 06/25/2018 Document Reviewed: 02/03/2016 Elsevier Patient Education  2020 [employer].

## 2019-03-11 NOTE — Op Note (Signed)
See [facility] OP note scanned into chart. Also because of the size, density, location and other factors that cannot be anticipated I feel this will likely be a staged procedure. This fact supersedes any indication in the scanned [facility] operative note to the contrary.  

## 2019-04-08 ENCOUNTER — Encounter: Payer: Self-pay | Admitting: Gastroenterology

## 2019-05-10 ENCOUNTER — Other Ambulatory Visit: Payer: Self-pay

## 2019-05-10 ENCOUNTER — Ambulatory Visit: Payer: 59

## 2019-05-10 VITALS — Ht 68.5 in | Wt 195.0 lb

## 2019-05-10 DIAGNOSIS — Z86010 Personal history of colonic polyps: Secondary | ICD-10-CM

## 2019-05-10 MED ORDER — NA SULFATE-K SULFATE-MG SULF 17.5-3.13-1.6 GM/177ML PO SOLN: 1.0000 | Freq: Once | ORAL | 0 refills | Status: AC

## 2019-05-10 NOTE — Progress Notes (Unsigned)
Denies allergies to eggs or soy products. Denies complication of anesthesia or sedation. Denies use of weight loss medication. Denies use of O2.   Emmi instructions given for colonoscopy.  

## 2019-05-10 NOTE — Progress Notes (Unsigned)
Joshua Collins   DOB: Aug 04, 1970 MRN: [id number] Procedure Date: *** Arrival Time: *** Procedure Time: ***   Location of Procedure: [facility] (4th Floor)                                          [place], [place] [address]  -[place] [facility] [facility] [facility] Doors open at 645 am   Check in at the receptionist's desk on the 4th floor before having a seat in the  main lobby.   [place] COVID TESTING INFORMATION  Due to recent COVID-19 restrictions implemented by [employer] and state authorities and in an effort to keep both patients and staff as safe as possible, [place] requires COVID-19 testing prior to any scheduled endoscopic procedure. The testing center is located at [address] [place], [place] [address]. You will go inside the building for this test.  Testing will be performed Monday through Friday 8 am- 4 pm. Your COVID screen will be 2 business days prior to your endoscopic procedure.   You are scheduled for your COVID screening on *** at *** .  You are not required to quarantine after your screening.  You will only receive a phone call with the results if it is POSITIVE.  If you do not receive a call the day before your procedure you should begin your prep, if ordered, and you should report to the endo center for your procedure at your designated appointment arrival time ( one hour prior to the procedure time). There is no cost to you for the screening on the day of the swab.  [facility] will file with your insurance company for the testing.   PLEASE TAKE YOUR INSURANCE CARD TO THE TESTING SITE.  If you need to call and schedule a COVID test after Pre Visit,  Call [facility] to schedule this appointment at [phone] and tell the Operator you are calling about COVID TESTING in order to be directed to the right area.    You may receive an automated phone call prior to your procedure or have a message in your MyChart that you have an appointment for a BP/15 at the [facility], please DISREGARD  this message.  You will  NOT have your BP checked the day of your COVID testing. DO NOT GO TO [place] FOR A BP CHECK!!!!  Your testing will be at the [address] location, [facility] location, inside the building.   If you are leaving [facility] travel [place] on [address], turn left onto [facility], turn night onto [address] at the 1st stop light turn right, pass the [employer] on your right and proceed to [place] (white building).    PREPARATION FOR COLONOSCOPY WITH SUPREP  Please pick up your  prep from the pharmacy a few days after your previsit.  Please do not wait until the day before your procedure to pick it up.   On *** (5 days prior to the procedure) stop eating nuts, seeds, popcorn, corn, beans, peas, salads, or any raw vegetables.  Do not take any fiber supplements (e.g. Metamucil, Citrucel, and Benefiber).  THE DAY BEFORE PROCEDURE:   DATE: ***   DAY: {Time; days of week:30300}  1. Drink clear liquids the entire day - NO SOLID FOOD  2. Do not drink anything colored red or purple. Avoid juices with pulp. No orange juice, grapefruit, or tomato juice.  3. Drink at least 64 oz. (8 glasses) of fluid/clear liquids during the day to prevent dehydration and help the prep work efficiently.  4. Drink one 8-10 oz glass of a clear liquid once an hour   Clear liquids include:NO RED NO PURPLE  Water, ice               tea/coffee (sugar is ok, but no milk or cream)      juice (apple, white grape, white cranberry) clear bouillon          broth (beef/chicken/vegetable)                             strained chicken noodle soup-no noodles  Jello                        Lemonade                                                             Kool-aid Popsicles               powdered fruit flavored drinks                              Gatorade                                                      Soft drinks ( coke, pepsi, sprite, mt.dew, etc)                                     hard candy   FOLLOW THESE PREP INSTRUCTIONS, NOT INSTRUCTION ON SUPREP BOX.   THE DAY BEFORE PROCEDURE: At 6:00pm: COMPLETE STEPS 1 THROUGH 4 BELOW:  Step 1: Pour ONE (1) 6-ounce bottle of SUPREP liquid into the mixing container. Step 2: Add cool drinking water to the 16-ounce line on the container and mix.   Step 3: Drink ALL of the 16-ounce diluted prep over 30 minutes. Step 4: You MUST drink an additional two (2) or more 16-ounce containers of water  over the next one (1) hour.   Continue to drink clear liquids until bedtime.  ONLY DRINK ONE BOTTLE ( OR HALF)  OF PREP THE EVENING BEFORE YOUR PROCEDURE.   THE SECOND BOTTLE (OR HALF)  MUST BE 5  HOURS BEFORE THE PROCEDURE ON THE DAY OF THE COLONOSCOPY.  DAY OF PROCEDURE:   DATE: ***       DAY: {Time; days of week:30300}  STAY ON CLEAR LIQUIDS, NO SOLID FOOD  Beginning at  ***  (5 hours before the procedure):  COMPLETE STEPS 1 THROUGH 4 BELOW:  Step 1: Pour ONE (1) 6-ounce bottle of SUPREP liquid into the mixing container. Step 2: Add cool drinking water to the 16-ounce line on the container and mix.   Step 3: Drink ALL of the 16-ounce diluted prep over 30 minutes.    Step 4: You MUST drink an additional two (2) or more 16-ounce containers of water over the next one (1) hour.  You may drink clear liquids until *** (3 hours before procedure).  Stop drinking ALL LIQUIDS  including water, no gum, candy, or medicines 3 hours before the procedure    MEDICATION INSTRUCTIONS  Unless otherwise instructed, you should take regular prescription medications with a small sip of water as early as possible the morning of your procedure.  You should NOT stop any blood thinners, including  Aspirin , unless instructed to do so by your gastroenterologist.   Take allowed medicines by ***  Diabetic patients - ***  Stop taking Plavix or Aggrenox on *** (7 days before procedure)  Stop taking Coumadin on *** (5 days before procedure)   If you use any type of inhaler please bring them with you to your procedure.   Call office [phone])- [phone] if you have fever 2 days before procedure                                                      CARE PARTNER   You will need a responsible adult at least 50 years of age to act as your care partner on the day of your procedure. This person needs to arrive with you to the facility, stay here during your procedure and then drive you home afterwards.  We cannot start your procedure unless your care partner is present in our facility. The total time from sign in until discharge is approximately 2-3 hours.  Before you leave, your doctor will review the findings and recommendation with you (and your care partner, if you give permission).  Due to the COVID-19 pandemic we are asking patients to follow these guidelines. Please only bring one care partner. Please be aware that your care partner may wait in the car in the parking lot or if they feel like they will be too hot or cold to wait in the car, they may wait in the lobby on the 4th floor. All care partners are required to wear a mask the entire time (we do not have any that we can provide them), they need to practice social distancing, and we will do a Covid check for all patient's and care partners when you arrive. Also we will check their temperature and your temperature. If the care partner waits in their car they need to stay in the parking lot the entire time and we will call them on their cell phone when the patient is ready for discharge so they can bring the car to the front of the building. Also all patient's will need to wear a mask into building.  WHAT TO WEAR/BRING   Wear  loose fitting  clothing that is easily removed. Leave jewelry and other valuables at home. However, you may wish to bring a book to read as you wait for your procedure to start.  Belongings will be given to care partner to keep while you are in the procedure room.  Remove all body piercing jewelry and leave at home.  You should not wear any red or dark colored fingernail polish..Please do not wear any scented lotions, perfumes, or colognes the day of the procedure.  Use of personal electronic devices such as cell phones, iPods, iPads, MP3 players, audio recorders, etc will not be allowed past the waiting area. Patients will be prompted to leave their device in the waiting area when they are called back to begin the admitting process.   ACTIVITY: You should plan to take it easy the day of your procedure. DO NOT DRIVE.  DO NOT DRINK ANY ALCOHOL. No working! You can resume normal activity the next day.   CANCELLATION POLICY We need ample time to take care of all of our patients and so we require 2 full business days notice for non-emergent cancellation of a procedure.  Failure to give 2 full days notice may result in a fee:  $100 for a single procedure (upper or lower endoscopy) $200 for a double procedure (upper and lower endoscopy)  If the day of your procedure is:   ***      You need to notify us by 5pm on: Monday         Wednesday Tuesday         Thursday Wednesday         Friday Thursday         Monday Friday          Tuesday   FREQUENTLY ASKED QUESTIONS SHOULD I STOP MY ASPIRIN? You should not stop any blood thinners, including Aspirin, unless instructed to do so by your gastroenterologist.  WHEN DO I START THE PREP? Please refer to your personalized prep instructions regarding the start time.  We realize, however, that you may work until later than this time. If so, you can begin taking your preparation as soon as you get home.  But realize that the later you start, the later you'll be up going to the  bathroom. HOW CAN I IMPROVE THE TASTE OF THE PREP?  All the solutions have a salty aftertaste. You can try any one or all of these suggestions to improve or overcome this taste: . Hold hard candy in your mouth while drinking the solution. Cheri Rous each glass with swallows of another beverage (juice, Coke, etc.). Celene Skeen on a Popsicle or sucker while drinking the solution. Sarina Ser flavored gum while drinking the solution.  You may use a straw while drinking the prep. WHAT IF I GET SICK DURING THE PREP?  Stop drinking the solution and wait for 30-45 minutes. Let your system settle down. Try drinking small sips of Coke or other beverage.  Begin the solution again, using some of the suggestions above if the flavor is the problem.  WHEN WILL THE PREP START TO WORK?  Everyone is different in the amount of time that it takes the purgative (laxative) to work. Some people begin to stool in the first hour, others not until the fourth hour or later. Activity is helpful in stimulating the bowel so, if possible, do not sit and wait for the bowel to act - remain active.  DO I HAVE TO DRINK  ALL OF THE PREP?  Yes.  In order to give you the best examination possible, it is important to drink all of the solution in the set amount of time. Your personalized intructions above will explain that if full detail. In the event that you have tried everything suggested and still cannot complete the preparation, please call us at [phone].  If it is after hours or on the weekend, the answering service will reach the doctor on call for you.  If your colon is inadequately prepped, a repeat colonoscopy or further testing may be recommended.  Should this happen, your insurance may not cover an additional procedure.   WHAT TYPES OF SEDATION ARE USED AT THE [facility]? There are two types of sedation.  Your gastroenterologist will decide which type you will need based on your personal medical issues as well as their own preference. An  IV of normal saline will be started, whether you will be receiving sedation or not, for your safety. This is our policy at the [employer].    Moderate Sedation is rarely used in the [place]. If it is required, your MD will discuss this with you.   Deep Sedation (Monitored Anesthesia Care) is achieved using a short acting IV anesthetic (diprivan, Propofol) that promotes relaxation and sleep.  This medicine is administered by a CRNA (nurse anesthetist) skilled and credentialed in [place].  You should not remember the procedure itself, but you should be able to remember the discharge instructions and the discussion with your physician afterwards. You can resume normal activity the next day.  You will receive a separate bill for this type of sedation.  This type of sedation is generally more reliable for patients who take certain chronic medications or with certain medical conditions and so it may be preferred.  If you have any questions about the type of sedation that will be used for your procedure, your gastroenterologist will be happy to discuss it with you.  WHY DO I NEED A DRIVER?  The medicines used for your sedation cause delayed reflexes, impair thinking and judgment, and have some amnesic effect, therefore affecting your ability to drive safely. Even though you may feel alright, you are instructed to refrain from driving, operating any type of machinery, making any critical decisions, or signing any legal documents until the next day  [place]?  Leave jewelry, purses and wallets at home. Wear loose fitting, easily removed clothing (e.g. no pantyhose or girdles).  NO cell phones. You may wish to bring a book to read or a magazine CAN I WEAR DENTURES?  Yes, you may wear your dentures. However, you may be asked to remove them prior to your procedure.  CAN I  WEAR MY CONTACTS?  We advise that you leave your contact lenses at home and wear your glasses instead.  If you do wear you lenses, you may be asked to remove them prior to your procedure so please bring a case for them and also a pair of glasses to wear after your procedure.  IS THE TEST SAFE DURING MY MENSTRUAL PERIOD?  Yes, your procedure can still be performed.  WILL THE DOCTOR TALK WITH ME AFTERWARDS? Yes, your physician will review the findings, follow-up care instructions, and treatment recommendations with you.  This will also be reviewed with your care partner if you have explicitly given [place] permission.  It is important that your care partner realizes you may not remember much after the procedure  due to the effects of the anesthesia and so they will need to be able to review this information with you later. HOW LONG WILL I BE THERE? The whole process takes 2-3 hours.  Most of that time is spent checking you in and waking you up safely after the procedure.  The colonoscopy itself takes 20-30 minutes usually.  Obviously, there may be unforseen delays and we will appreciate your patience if that occurs. WHAT TO EXPECT AFTER THE PROCEDURE?   Some feelings of bloating in the abdomen.  Passage of more gas than usual.  Walking can help get rid of the air that was put into your GI tract during the procedure and reduce the bloating. You may notice spotting of blood in your stool or on the toilet paper. Since you completed a bowel prep for your procedure you may not have a normal bowel movement for a few days. WHAT CAN I EAT AFTER THE PROCEDURE?   We do recommend a small meal at first , but then you may proceed to your regular diet.  Drink plenty of fluids but you should avoid alcohol for 24 hours. WHAT IF THE WEATHER IS TERRIBLE?  We will make every effort to contact you if the [place] is going to close or have a delayed opening due to weather.  If you do not hear from [place], then you can assume the center is open.  If you choose to cancel due to severe weather, you will not be expected to pay the cancellation fee  described above. After hours you can call our weather line at [phone]. The message will be updated with any changes to our schedule.   SIGNATURES/CONFIDENTIALITY You have signed paperwork which will be entered into your electronic medical record.  This attests to the fact that that the information above has been reviewed and is understood.  Full responsibility of the confidentiality lies with you and/or your care-partner.   YOUR FINANCIAL RESPONSIBILITY If you have insurance, you will need to contact your insurance company to verify that you have active coverage and to determine the amount of coverage they will provide. It is important to tell them that you are having the procedure performed at an [facility]). They can tell you what portion of the cost will be your responsibility, usually expressed either as a fixed amount or as a percentage of overall cost. We will also be contacting your insurance company with information about your procedure in order to obtain pre-certification.  You must contact your insurance company as well, failure to do so may result in you having to pay a greater portion of the cost or even the total cost of the procedure.  YOU MAY RECEIVE THE FOLLOWING BILLS 1. The [facility] will bill a facility fee for use of the procedure room, medication and supplies. 2. Your [place] gastroenterologist will bill a professional fee for performing the procedure. 3. If a biopsy is performed you will receive a bill from [facility] for their professional fee and a bill from [employer] for processing the pathology sample. 4. If you receive diprivan for sedation during your procedure, you will receive a bill from [facility] for the administration of the medication.   Complaints or questions regarding billing, payment by third party payers or payment plans can be directed to the Customer Service Department of  [facility] [place], [address], [place], [place], [place] [address].  Phone inquiries  may be made at [facility] health [phone], Monday through Friday 8am to 5 p.m., or you may visit the [facility] website at: [email or web address], click on "For Patients", then click "Questions about your bill.  [place] ([place])                                The [place] ([place]) is an independent, freestanding [place] ([place]) located on the fourth floor of the [facility] at [place], [place].  It is licensed by the [employer] [place], certified by [employer] and is accredited by [employer].   The [facility] physicians established the [place] in 1992. The physicians of [employer] joined the [facility] in 1999, and the [place] is now owned by [employer].  We completed a major renovation in 2006 and the expanded [place] now provides greater privacy and comfort for our patients and their family members.  We have invested in state-of-the-art facilities and equipment to ensure that our patients receive the most up to date and best care.  At the [facility], board-certified gastroenterologists perform elective diagnostic and therapeutic endoscopic procedures such as endoscopy and colonoscopy.  Hours of operation are from 7:00 a.m. to 5:30 p.m. Monday - Friday.  Outside of the posted hours of operation, urgent or emergent care is provided at [place]. [place] physicians also provide 24-hour emergency coverage.  After hours and on weekends, the on-call physician may be reached by calling [phone].  The answering service will take a message and have the physician on call contact you.  Your Rights and Responsibilities as Our Patient  This center is owned by [employer] and is governed by the Gastroenterologists of [facility] [employer]. You may exercise the following rights without being subjected to discrimination or reprisal.                                    PATIENT RIGHTS- You have a right to : * Considerate, respectful, and safe care that is free from abuse or harassment.  * A discussion of your illness, what we can do about it, and the likely outcome of care.  * Know the names and roles of the people caring for you here.  * Respectful and effective pain management.  * Receive as much information to consent to or refuse a course of treatment or invasive procedure and to actively participate in decisions regarding your medical care.  * Involve your health care proxy or significant others in the decision making process for medical decisions.  * Reasonable continuity of care and to know in advance the time and location of an appointment as well as the doctor you are seeing.  * Full consideration of personal privacy and confidentiality of your medical information. Your written permission will be obtained prior to releasing any medical information. When we do release your information to others, we ask them to keep them confidential.  * Review your medical record and ask questions unless restricted by law.  * Know of any relationships with other parties that may influence your care.  * Know about rules that affect your care and about charges and payment methods. You have a right to receive and examine an explanation of your bill regardless  of the source of payment.  * Receive assistance with the transfer of care from one doctor to another doctor within our practice or to an external doctor not in our practice.  * You have a right to develop a living will or healthcare power of attorney although, since the procedures that we do are not high risk, we will do all that is necessary to stabilize you including CPR if an emergency occurs. EMS will be called and you will be transferred to the hospital.  * Voice your concerns,  complaints, or problems with the care you received by contacting our manager at [phone] or Nurse Manager at [phone]. If we are unable to satisfactorily address your complaint, you may contact the [facility] by phone at [phone], [facility] by phone at [phone] or online at [email or web address] or the [facility] Complaint Intake Unit by phone at [phone] or [phone], by mail at [facility], [place], [place], or online at [email or web address].                       PATIENT RESPONSIBILITIES-You agree to :  * Provide accurate and complete information concerning your symptoms, past history, current health status, and medications including over-the-counter products and dietary supplements.  * Make known whether you clearly comprehend your medical care and what is expected of you in the plan of care.  * Participate in the development of the treatment plan and follow care instructions given to you.  * Follow the treatment plan and care instructions given to you.  * Keep appointments and notify us if you are unable to do so.  * Accept responsibility for your actions if you refuse planned treatment or do not follow your doctor's orders.  * Accept financial responsibility for care received and pay promptly.  * Follow facility policies and procedures.  * Inform my doctor about any living will, medical healthcare power of attorney, or other directive that may affect my medical care.  * Be respectful of all healthcare providers and staff as well as other patients.  * Inform the staff of any discomfort or pain and patient safety issues.  * Share your values, beliefs, and traditions to help the staff provide appropriate care.  * Provide a responsible adult to transport you home and remain with you if you receive sedation medications.                                Additional Information for Medicare Patients  All issues, concerns, or complaints can be  reported by contacting our Pensions consultant. If we are unable to address your concerns, you may contact the following for assistance.  1. Medicare Ombudsman LoyaltyUs.is.asp  [place] Ombudsman - Aging and Adult Services [phone] [email or web address] [email or web address]   Advance Directives - Living Will or [place] of Attorney Resources  For applicable state laws and sample forms for creating a living will or healthcare power of attorney, you may contact one of the following.  1. C[employer] Organization at [phone] for English or [phone] for other languages or www.caringinfo.org  2. [place] of Aging and Adult Services at [phone] or [email or web address]  3. [facility] at [phone] or [phone] or www.[facility].org   Advance Directive Policy:  Please be aware that the procedures that we do in this facility are not high risk and that in an emergency, we will do  all that is necessary to stabilize you including Basic Life Support (BLS) and Advanced Cardiac Life Support (ACLS).  If you present to this center for a procedure with a living will or valid Do Not Resuscitate Order (DNR) or Out of Facility form and you have an emergency, we will do all that we can to stabilize your medical condition and we may begin [place] (BLS) and Advanced [place] Life Support (ACLS). We will call 911 to transport you to the hospital. EMS will be informed of the Do Not Resuscitate Order or living will upon arrival.                                             COMPLAINT/GRIEVANCE PROCESS The [facility] recognizes that patients have the right to voice concerns without fear of discrimination or reprisal, and to have these concerns reviewed and responded to in a timely manner. [facility] seeks to provide prompt review and timely resolution of complaints or grievances from any patient.    You may voice your  concerns, complaints, or problems with the care you have received or are receiving to any staff member at any point in your care.  Every effort will be made to reconcile your concern or complaint while you are still in the [facility].  However, if you wish to voice a concern or complaint after you have left the center, you may contact the Nursing Supervisor at [phone] or our Administrative Director of Gastroenterology at [phone].  If we are unable to satisfactorily address your complaint, you may contact the [facility], Complaint Intake Unit ([address] [place], [place], [place], [place] [address], phone: [phone] or [phone]) or by e-mail at [email or web address].    You may also contact the Office of the Medicare Ombudsman to file a grievance at [place] ([phone]) or at [email or web address]

## 2019-05-20 ENCOUNTER — Encounter: Payer: Self-pay | Admitting: Gastroenterology

## 2019-05-21 ENCOUNTER — Other Ambulatory Visit: Payer: Self-pay | Admitting: Gastroenterology

## 2019-05-21 ENCOUNTER — Ambulatory Visit (INDEPENDENT_AMBULATORY_CARE_PROVIDER_SITE_OTHER): Payer: 59

## 2019-05-21 DIAGNOSIS — Z1159 Encounter for screening for other viral diseases: Secondary | ICD-10-CM

## 2019-05-21 LAB — SARS CORONAVIRUS 2 (TAT 6-24 HRS): SARS Coronavirus 2: NEGATIVE

## 2019-05-24 ENCOUNTER — Ambulatory Visit (AMBULATORY_SURGERY_CENTER): Payer: 59 | Admitting: Gastroenterology

## 2019-05-24 ENCOUNTER — Other Ambulatory Visit: Payer: Self-pay

## 2019-05-24 ENCOUNTER — Encounter: Payer: Self-pay | Admitting: Gastroenterology

## 2019-05-24 VITALS — BP 162/89 | HR 52 | Temp 96.2°F | Resp 9 | Ht 68.5 in | Wt 195.0 lb

## 2019-05-24 DIAGNOSIS — K6282 Dysplasia of anus: Secondary | ICD-10-CM | POA: Diagnosis not present

## 2019-05-24 DIAGNOSIS — K5140 Inflammatory polyps of colon without complications: Secondary | ICD-10-CM | POA: Diagnosis not present

## 2019-05-24 DIAGNOSIS — Z86010 Personal history of colonic polyps: Secondary | ICD-10-CM | POA: Diagnosis not present

## 2019-05-24 DIAGNOSIS — D120 Benign neoplasm of cecum: Secondary | ICD-10-CM

## 2019-05-24 MED ORDER — SODIUM CHLORIDE 0.9 % IV SOLN: 500.0000 mL | Freq: Once | INTRAVENOUS | Status: DC

## 2019-05-24 NOTE — Op Note (Signed)
[facility] [place] [facility] Patient Name: Joshua Collins Procedure Date: 05/24/2019 7:56 AM MRN: [id number] Endoscopist: Remo Lipps P. Havery Moros , MD Age: 49 Referring MD:  Date of Birth: 17-Sep-1970 Gender: Male Account #: [id number] Procedure:                Colonoscopy Indications:              High risk colon cancer surveillance: Personal                            history of colonic polyps (history of polyp along                            the transition from IC valve to ileum 3 years ago -                            was difficult to visualize) Medicines:                Monitored Anesthesia Care Procedure:                Pre-Anesthesia Assessment:                           - Prior to the procedure, a History and Physical                            was performed, and patient medications and                            allergies were reviewed. The patient's tolerance of                            previous anesthesia was also reviewed. The risks                            and benefits of the procedure and the sedation                            options and risks were discussed with the patient.                            All questions were answered, and informed consent                            was obtained. Prior Anticoagulants: The patient has                            taken no previous anticoagulant or antiplatelet                            agents. ASA Grade Assessment: II - A patient with                            mild systemic disease. After reviewing the risks  and benefits, the patient was deemed in                            satisfactory condition to undergo the procedure.                           After obtaining informed consent, the colonoscope                            was passed under direct vision. Throughout the                            procedure, the patient's blood pressure, pulse, and                            oxygen saturations were  monitored continuously. The                            Colonoscope was introduced through the anus and                            advanced to the the terminal ileum, with                            identification of the appendiceal orifice and IC                            valve. The colonoscopy was performed without                            difficulty. The patient tolerated the procedure                            well. The quality of the bowel preparation was                            good. The terminal ileum, ileocecal valve,                            appendiceal orifice, and rectum were photographed. Scope In: 8:02:34 AM Scope Out: 8:21:11 AM Scope Withdrawal Time: 0 hours 16 minutes 56 seconds  Total Procedure Duration: 0 hours 18 minutes 37 seconds  Findings:                 The digital rectal exam findings include polypoid                            lesion (hypertrophied anal papillae).                           The terminal ileum appeared normal.                           A 2 to 3 mm polyp was found along the entrance the  ileocecal valve. The polyp was sessile. It could                            not be grasped with the snare. The polyp was                            removed with a cold biopsy forceps. Resection and                            retrieval were complete.                           An anal papillae was hypertrophied, unchaged in                            appearance compared to the previous exam. Biopsies                            were taken with a cold forceps for histology to                            rule out AIN.                           Internal hemorrhoids were found during retroflexion.                           The exam was otherwise without abnormality. Complications:            No immediate complications. Estimated blood loss:                            Minimal. Estimated Blood Loss:     Estimated blood loss was  minimal. Impression:               - Polypoid lesion (hypertrophied anal papillae)                            found on digital rectal exam.                           - The examined portion of the ileum was normal.                           - One 2 to 3 mm polyp at the ileocecal valve,                            removed with a cold biopsy forceps. Resected and                            retrieved.                           - Anal papilla(e) was hypertrophied. Biopsied to  rule out AIN.                           - Internal hemorrhoids.                           - The examination was otherwise normal. Recommendation:           - Patient has a contact number available for                            emergencies. The signs and symptoms of potential                            delayed complications were discussed with the                            patient. Return to normal activities tomorrow.                            Written discharge instructions were provided to the                            patient.                           - Resume previous diet.                           - Continue present medications.                           - Await pathology results. Remo Lipps P. Capitola Ladson, MD 05/24/2019 8:27:16 AM This report has been signed electronically.

## 2019-05-24 NOTE — Patient Instructions (Signed)
HANDOUTS PROVIDED ON:  POLYPS & HEMORRHOIDS  The polyp removed today have been sent for pathology.  The results can take 1-3 weeks to receive.  When your next colonoscopy should occur will be based on the pathology results.    You may resume your previous diet and medication schedule.  Thank you for allowing [place] to care for you today!!!  YOU HAD AN ENDOSCOPIC PROCEDURE TODAY AT [facility]:   Refer to the procedure report that was given to you for any specific questions about what was found during the examination.  If the procedure report does not answer your questions, please call your gastroenterologist to clarify.  If you requested that your care partner not be given the details of your procedure findings, then the procedure report has been included in a sealed envelope for you to review at your convenience later.  YOU SHOULD EXPECT: Some feelings of bloating in the abdomen. Passage of more gas than usual.  Walking can help get rid of the air that was put into your GI tract during the procedure and reduce the bloating. If you had a lower endoscopy (such as a colonoscopy or flexible sigmoidoscopy) you may notice spotting of blood in your stool or on the toilet paper. If you underwent a bowel prep for your procedure, you may not have a normal bowel movement for a few days.  Please Note:  You might notice some irritation and congestion in your nose or some drainage.  This is from the oxygen used during your procedure.  There is no need for concern and it should clear up in a day or so.  SYMPTOMS TO REPORT IMMEDIATELY:   Following lower endoscopy (colonoscopy or flexible sigmoidoscopy):  Excessive amounts of blood in the stool  Significant tenderness or worsening of abdominal pains  Swelling of the abdomen that is new, acute  Fever of 100F or higher  For urgent or emergent issues, a gastroenterologist can be reached at any hour by calling [phone].   DIET:  We do  recommend a small meal at first, but then you may proceed to your regular diet.  Drink plenty of fluids but you should avoid alcoholic beverages for 24 hours.  ACTIVITY:  You should plan to take it easy for the rest of today and you should NOT DRIVE or use heavy machinery until tomorrow (because of the sedation medicines used during the test).    FOLLOW UP: Our staff will call the number listed on your records 48-72 hours following your procedure to check on you and address any questions or concerns that you may have regarding the information given to you following your procedure. If we do not reach you, we will leave a message.  We will attempt to reach you two times.  During this call, we will ask if you have developed any symptoms of COVID 19. If you develop any symptoms (ie: fever, flu-like symptoms, shortness of breath, cough etc.) before then, please call [phone].  If you test positive for Covid 19 in the 2 weeks post procedure, please call and report this information to [place].    If any biopsies were taken you will be contacted by phone or by letter within the next 1-3 weeks.  Please call us at [phone] if you have not heard about the biopsies in 3 weeks.    SIGNATURES/CONFIDENTIALITY: You and/or your care partner have signed paperwork which will be entered into your electronic medical record.  These signatures  attest to the fact that that the information above on your After Visit Summary has been reviewed and is understood.  Full responsibility of the confidentiality of this discharge information lies with you and/or your care-partner.

## 2019-05-24 NOTE — Progress Notes (Signed)
Temp-JB VS-CW  Pt's states no medical or surgical changes since previsit or office visit.  

## 2019-05-24 NOTE — Progress Notes (Signed)
Called to room to assist during endoscopic procedure.  Patient ID and intended procedure confirmed with present staff. Received instructions for my participation in the procedure from the performing physician.  

## 2019-05-28 ENCOUNTER — Telehealth: Payer: Self-pay | Admitting: *Deleted

## 2019-05-28 NOTE — Telephone Encounter (Signed)
  Follow up Call-  Call back number 05/24/2019  Post procedure Call Back phone  # [phone]  Permission to leave phone message Yes  Some recent data might be hidden     Patient questions:  Do you have a fever, pain , or abdominal swelling? No. Pain Score  0 *  Have you tolerated food without any problems? Yes.    Have you been able to return to your normal activities? Yes.    Do you have any questions about your discharge instructions: Diet   No. Medications  No. Follow up visit  No.  Do you have questions or concerns about your Care? No.  Actions: * If pain score is 4 or above: No action needed, pain <4.  1. Have you developed a fever since your procedure? NO  2.   Have you had an respiratory symptoms (SOB or cough) since your procedure? NO  3.   Have you tested positive for COVID 19 since your procedure NO  4.   Have you had any family members/close contacts diagnosed with the COVID 19 since your procedure?  NO   If yes to any of these questions please route to Joylene John, RN and Alphonsa Gin, RN.

## 2019-06-03 ENCOUNTER — Telehealth: Payer: Self-pay

## 2019-06-03 NOTE — Telephone Encounter (Signed)
Left message to please call back. °

## 2019-07-22 ENCOUNTER — Ambulatory Visit: Payer: Self-pay | Admitting: Surgery

## 2019-07-22 NOTE — H&P (Signed)
Joshua Collins Appointment: 07/22/2019 8:45 AM DOB: 22-Jun-1970   Patient Care Team: Fanny Bien, MD as PCP - General (Family Medicine) Michael Boston, MD as Consulting Physician (General Surgery) Armbruster, Carlota Raspberry, MD as Consulting Physician (Gastroenterology)  ` Patient sent for surgical consultation at the request of Dr. Lafe Garin  Chief Complaint: Persistent anal polyps now AIN1. Re-Reconsideration of surgery.  Patient had noted change in bowel habits and was concerned about change in caliber of stools. Underwent colonoscopy. Prolapsing anal crypt polyps noted. Surgery recommended. I saw him in early 2018. I offered surgery. He declined. He came in a year later. I again offered surgery. He did not schedule this. 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 Surgical consultation again recommended.   PRIOR NOTES: Patient with history of perianal complaints. Colonoscopy revealed some squamous anal polyps. Surgical consultation requested. I saw him over a year ago. I offered examination under anesthesia with removal of anal polyps. This did not happen. He returns a year later. He notes he had a lot of family and work stresses, so he tried to hold off on any intervention. It is not been particularly bothersome but he still notes the change in caliber of stools. Occasional blood. Occasionally irritation. Recently had a hemorrhoid flare with some irregular bowels. He has a few months this year that are potentially more quiet to allow surgery to happen. He is worried about a possible cancer given pain friend that died of colon cancer and brother who has polyps.  History of hemorrhoid problems in the past. Bowels usually under better control with ~2 bowel movements a day well-formed. Had some intermittent lumps popping out and bleeding and irritation. Family history of colon polyps  in his brothers. Underwent colonoscopy. Small polyp removed approximately. Had a few polyps at the anal verge. Recommended surgical evaluation and probable removal. Patient notes he got rash with the bowel prep concerning for cellulitis. He is due to see a dermatologist. An area of rash in his left armpit as well. No history of eczema or psoriasis that he knows of. His never had abdominal surgery. No prior anorectal interventions. He does not smoke. 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 He can walk a few miles without difficulty. He had bad hemorrhoid flares sitting on a donut and eating Preparation H but notes he's not had any recently. No thrombosed hemorrhoids or interventions.  No personal nor family history of GI/colon cancer, inflammatory bowel disease, irritable bowel syndrome, allergy such as Celiac Sprue, dietary/dairy problems, colitis, ulcers nor gastritis. No recent sick contacts/gastroenteritis. No travel outside the country. No changes in diet. No dysphagia to solids or liquids. No significant heartburn or reflux. No hematochezia, hematemesis, coffee ground emesis. No evidence of prior gastric/peptic ulceration.  (Review of systems as stated in this history (HPI) or in the review of systems. Otherwise all other 12 point ROS are negative)   Problem List/Past Medical [name], MD; 07/22/2019 9:10 AM) ANAL POLYP (K62.0) ENCOUNTER FOR PREOPERATIVE EXAMINATION FOR GENERAL SURGICAL PROCEDURE (Z01.818) PROLAPSED INTERNAL HEMORRHOIDS, GRADE 2 (K64.1)  Past Surgical History [name], MD; 07/22/2019 9:10 AM) Colon Polyp Removal - Colonoscopy  Diagnostic Studies History [name], MD; 07/22/2019 9:10 AM) Colonoscopy within last year  Allergies (Tanisha A. Owens Shark, [place]; 07/22/2019 8:37 AM) No Known Drug Allergies [03/31/2016]: Allergies Reconciled  Medication History (Tanisha A. Owens Shark, [place]; [id number] AM) Victoriano Lain (20MG  Tablet, Oral  daily) Active. Losartan Potassium-HCTZ (100-25MG  Tablet, Oral daily) Active. amLODIPine Besylate (10MG  Tablet, Oral) Active. Medications Reconciled  Social History [name], MD; 07/22/2019 9:10 AM) Alcohol use Occasional alcohol use. Caffeine use Coffee. No drug use Tobacco use Never smoker.  Family History [name], MD; 07/22/2019 9:10 AM) Hypertension Brother.  Other Problems [name], MD; 07/22/2019 9:10 AM) High blood pressure Kidney Stone     Review of Systems [name], MD; 07/22/2019 9:10 AM) General Not Present- Appetite Loss, Chills, Fatigue, Fever, Night Sweats, Weight Gain and Weight Loss. Skin Not Present- Change in Wart/Mole, Dryness, Hives, Jaundice, New Lesions, Non-Healing Wounds, Rash and Ulcer. HEENT Not Present- Earache, Hearing Loss, Hoarseness, Nose Bleed, Oral Ulcers, Ringing in the Ears, Seasonal Allergies, Sinus Pain, Sore Throat, Visual Disturbances, Wears glasses/contact lenses and Yellow Eyes. Respiratory Present- Snoring. Not Present- Bloody sputum, Chronic Cough, Difficulty Breathing and Wheezing. Breast Not Present- Breast Mass, Breast Pain, Nipple Discharge and Skin Changes. Cardiovascular Not Present- Chest Pain, Difficulty Breathing Lying Down, Leg Cramps, Palpitations, Rapid Heart Rate, Shortness of Breath and Swelling of Extremities. Gastrointestinal Not Present- Abdominal Pain, Bloating, Bloody Stool, Change in Bowel Habits, Chronic diarrhea, Constipation, Difficulty Swallowing, Excessive gas, Gets full quickly at meals, Hemorrhoids, Indigestion, Nausea, Rectal Pain and Vomiting. Male Genitourinary Not Present- Blood in Urine, Change in Urinary Stream, Frequency, Impotence, Nocturia, Painful Urination, Urgency and Urine Leakage. Musculoskeletal Not Present- Back Pain, Joint Pain, Joint Stiffness, Muscle Pain, Muscle Weakness and Swelling of Extremities. Neurological Not Present- Decreased Memory, Fainting,  Headaches, Numbness, Seizures, Tingling, Tremor, Trouble walking and Weakness. Psychiatric Not Present- Anxiety, Bipolar, Change in Sleep Pattern, Depression, Fearful and Frequent crying. Endocrine Not Present- Cold Intolerance, Excessive Hunger, Hair Changes, Heat Intolerance and New Diabetes. Hematology Not Present- Blood Thinners, Easy Bruising, Excessive bleeding, Gland problems, HIV and Persistent Infections.  Vitals (Tanisha A. Brown RMA; 07/22/2019 8:37 AM) 07/22/2019 8:37 AM Weight: 198.2 lb Height: 68in Body Surface Area: 2.04 m Body Mass Index: 30.14 kg/m  Temp.: 97.77F  Pulse: 67 (Regular)  BP: 128/74(Sitting, Left Arm, Standard)        Physical Exam [name] MD; 07/22/2019 9:25 AM)  General Mental Status-Alert. General Appearance-Not in acute distress. Voice-Normal. Note: Relaxed. Nontoxic.  Integumentary Global Assessment Upon inspection and palpation of skin surfaces of the - Distribution of scalp and body hair is normal. General Characteristics Overall examination of the patient's skin reveals - no rashes and no suspicious lesions.  Head and Neck Head-normocephalic, atraumatic with no lesions or palpable masses. Face Global Assessment - atraumatic, no absence of expression. Neck Global Assessment - no abnormal movements, no decreased range of motion. Trachea-midline. Thyroid Gland Characteristics - non-tender.  Eye Eyeball - Left-Extraocular movements intact, No Nystagmus - Left. Eyeball - Right-Extraocular movements intact, No Nystagmus - Right. Upper Eyelid - Left-No Cyanotic - Left. Upper Eyelid - Right-No Cyanotic - Right.  Chest and Lung Exam Inspection Accessory muscles - No use of accessory muscles in breathing.  Abdomen Note: Abdomen soft. Nontender, nondistended. No guarding. No diastasis. No umbilical nor other hernias  Male Genitourinary Note: No inguinal hernias. Normal external genitalia.  Epididymi, testes, and spermatic cords normal without any masses.  Rectal Note: ` ` ` Very sensitive/guarded but consolable. Pedunculated polyps felt at anal verge. Right posterior pedunculated at least 2 cm. Almost comes out of the anal canal Most likely consistent with hypertrophic anal polyps versus chronic hemorrhoids. Does feel more fibrotic consistent with AIN changes. However mobile. He  is not able to tolerate anoscopy was, so I held off.  Perianal skin clean with good hygiene. No pruritis ani. No pilonidal disease. No fissure. No abscess/fistula. Normal sphincter tone.  No external hemorrhoids. No condyloma warts. No other rectal masses. Prostate normal size and smooth.  Peripheral Vascular Upper Extremity Inspection - Left - Not Gangrenous, No Petechiae. Inspection - Right - Not Gangrenous, No Petechiae.  Neurologic Neurologic evaluation reveals -normal attention span and ability to concentrate, able to name objects and repeat phrases. Appropriate fund of knowledge and normal coordination.  Neuropsychiatric Mental status exam performed with findings of-able to articulate well with normal speech/language, rate, volume and coherence and no evidence of hallucinations, delusions, obsessions or homicidal/suicidal ideation. Orientation-oriented X3.  Musculoskeletal Global Assessment Gait and Station - normal gait and station.  Lymphatic General Lymphatics Description - No Generalized lymphadenopathy.    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 I don't think he truly has new condylomata.  Regardless, I think he would benefit from examination under anesthesia with removal masses. Outpatient surgery. He is ready to consider. He is hoping to get this done in June.  Current Plans Pt Education - [facility] Anal Warts /  AIN Dysplasia (AT)  ANAL POLYP (K62.0) Impression: Pedunculated anal masses most likely hypertrophic papillae in the setting of grade 2 internal hemorrhoids. Causing intermittent bleeding and irritation but not to the point of fully prolapsing yet. Now with an changes. He is ready to re-reconsider surgery  Current Plans You are being scheduled for surgery- Our schedulers will call you.  You should hear from our office's scheduling department within 5 working days about the location, date, and time of surgery. We try to make accommodations for patient's preferences in scheduling surgery, but sometimes the OR schedule or the surgeon's schedule prevents [place] from making those accommodations.  If you have not heard from our office [phone]) in 5 working days, call the office and ask for your surgeon's nurse.  If you have other questions about your diagnosis, plan, or surgery, call the office and ask for your surgeon's nurse.  The anatomy & physiology of the anorectal region was discussed. The pathophysiology of anorectal warts and differential diagnosis was discussed. Natural history risks without surgery was discussed such as further growth and cancer. I stressed the importance of office follow-up to catch early recurrence & minimize/halt progression of disease. Interventions such as cauterization or cryotherapy by topical agents were discussed.  The patient's symptoms are not adequately controlled by non-operative treatments. I feel the risks & problems of no surgery outweigh the operative risks; therefore, I recommended surgery to treat the anal warts by removal, ablation and/or cauterization.  Risks such as bleeding, infection, need for further treatment, heart attack, death, and other risks were discussed. I noted a good likelihood this will help address the problem. Goals of post-operative recovery were discussed as well. Possibility that this will not correct all symptoms was  explained. Post-operative pain, bleeding, constipation, and other problems after surgery were discussed. We will work to minimize complications. Educational handouts further explaining the pathology, treatment options, and bowel regimen were given as well. Questions were answered. The patient expresses understanding & wishes to proceed with surgery.   ENCOUNTER FOR PREOPERATIVE EXAMINATION FOR GENERAL SURGICAL PROCEDURE (Z01.818)  Current Plans Pt Education - [facility] Rectal Prep for Anorectal outpatient/office surgery: discussed with patient  and provided information. Pt Education - [facility] Rectal Surgery HCI (Eadie Repetto): discussed with patient and provided information.  PROLAPSED INTERNAL HEMORRHOIDS, GRADE 2 (K64.1) Impression: May benefit from hemorrhoidal ligation at the same time. We'll determine how the anatomy is once he is asleep and I can get a better examination under anesthesia  Current Plans Pt Education - [facility] Hemorrhoids (Tacha Manni): discussed with patient and provided information.  [name], MD, FACS, MASCRS Gastrointestinal and Minimally Invasive Surgery  [facility] [address] [place], [place] [address] [phone] Main / Paging [phone] Fax

## 2019-09-11 ENCOUNTER — Encounter (HOSPITAL_BASED_OUTPATIENT_CLINIC_OR_DEPARTMENT_OTHER): Payer: Self-pay | Admitting: Surgery

## 2019-09-11 ENCOUNTER — Other Ambulatory Visit: Payer: Self-pay

## 2019-09-11 NOTE — Progress Notes (Signed)
Spoke w/ via phone for pre-op interview--- PT Lab needs dos---- Istat and EKG              Lab results------ no COVID test ------ 09-16-2019 @ 0845 Arrive at ------- 0730 NPO after ------ MN w/ exception clear liquids until 0630 then nothing mouth  Medications to take morning of surgery ----- Prilosec w/ sips of water Diabetic medication ----- n/a Patient Special Instructions ----- pt asked about bowel prep, advised pt to call dr gross office for instructions because dr gross did not have any in pre-op orders  Pre-Op special Istructions ----- n/a Patient verbalized understanding of instructions that were given at this phone interview. Patient denies shortness of breath, chest pain, fever, cough a this phone interview.

## 2019-09-16 ENCOUNTER — Other Ambulatory Visit (HOSPITAL_COMMUNITY)
Admission: RE | Admit: 2019-09-16 | Discharge: 2019-09-16 | Disposition: A | Discharge status: Home / Self Care | Payer: 59 | Source: Ambulatory Visit | Attending: Surgery | Admitting: Surgery

## 2019-09-16 DIAGNOSIS — Z01812 Encounter for preprocedural laboratory examination: Secondary | ICD-10-CM | POA: Diagnosis present

## 2019-09-16 DIAGNOSIS — Z20822 Contact with and (suspected) exposure to covid-19: Secondary | ICD-10-CM | POA: Diagnosis not present

## 2019-09-16 LAB — SARS CORONAVIRUS 2 (TAT 6-24 HRS): SARS Coronavirus 2: NEGATIVE

## 2019-09-19 ENCOUNTER — Ambulatory Visit (HOSPITAL_BASED_OUTPATIENT_CLINIC_OR_DEPARTMENT_OTHER): Payer: 59 | Admitting: Anesthesiology

## 2019-09-19 ENCOUNTER — Ambulatory Visit (HOSPITAL_BASED_OUTPATIENT_CLINIC_OR_DEPARTMENT_OTHER)
Admission: RE | Admit: 2019-09-19 | Discharge: 2019-09-19 | Disposition: A | Discharge status: Home / Self Care | Payer: 59 | Attending: Surgery | Admitting: Surgery

## 2019-09-19 ENCOUNTER — Encounter (HOSPITAL_BASED_OUTPATIENT_CLINIC_OR_DEPARTMENT_OTHER): Payer: Self-pay | Admitting: Surgery

## 2019-09-19 ENCOUNTER — Other Ambulatory Visit: Payer: Self-pay

## 2019-09-19 ENCOUNTER — Encounter (HOSPITAL_BASED_OUTPATIENT_CLINIC_OR_DEPARTMENT_OTHER)
Admission: RE | Disposition: A | Discharge status: Home / Self Care | Payer: Self-pay | Source: Home / Self Care | Attending: Surgery

## 2019-09-19 DIAGNOSIS — K641 Second degree hemorrhoids: Secondary | ICD-10-CM | POA: Diagnosis not present

## 2019-09-19 DIAGNOSIS — K620 Anal polyp: Secondary | ICD-10-CM | POA: Diagnosis not present

## 2019-09-19 DIAGNOSIS — K219 Gastro-esophageal reflux disease without esophagitis: Secondary | ICD-10-CM | POA: Insufficient documentation

## 2019-09-19 DIAGNOSIS — G4730 Sleep apnea, unspecified: Secondary | ICD-10-CM | POA: Insufficient documentation

## 2019-09-19 DIAGNOSIS — Z79899 Other long term (current) drug therapy: Secondary | ICD-10-CM | POA: Diagnosis not present

## 2019-09-19 DIAGNOSIS — I10 Essential (primary) hypertension: Secondary | ICD-10-CM | POA: Insufficient documentation

## 2019-09-19 DIAGNOSIS — K6282 Dysplasia of anus: Secondary | ICD-10-CM | POA: Insufficient documentation

## 2019-09-19 HISTORY — DX: Calculus of kidney: N20.0

## 2019-09-19 HISTORY — DX: Unspecified hemorrhoids: K64.9

## 2019-09-19 HISTORY — DX: Dysplasia of anus: K62.82

## 2019-09-19 HISTORY — DX: Anal polyp: K62.0

## 2019-09-19 HISTORY — DX: Other allergic rhinitis: J30.89

## 2019-09-19 HISTORY — PX: RECTAL EXAM UNDER ANESTHESIA: SHX6399

## 2019-09-19 HISTORY — PX: TUMOR EXCISION: SHX421

## 2019-09-19 HISTORY — DX: Allergic rhinitis, unspecified: J30.9

## 2019-09-19 LAB — POCT I-STAT, CHEM 8
BUN: 13 mg/dL (ref 6–20)
Calcium, Ion: 1.22 mmol/L (ref 1.15–1.40)
Chloride: 99 mmol/L (ref 98–111)
Creatinine, Ser: 1.1 mg/dL (ref 0.61–1.24)
Glucose, Bld: 99 mg/dL (ref 70–99)
HCT: 48 % (ref 39.0–52.0)
Hemoglobin: 16.3 g/dL (ref 13.0–17.0)
Potassium: 3.1 mmol/L — ABNORMAL LOW (ref 3.5–5.1)
Sodium: 144 mmol/L (ref 135–145)
TCO2: 32 mmol/L (ref 22–32)

## 2019-09-19 SURGERY — EXAM UNDER ANESTHESIA, RECTUM
Anesthesia: General

## 2019-09-19 MED ORDER — PHENYLEPHRINE 40 MCG/ML (10ML) SYRINGE FOR IV PUSH (FOR BLOOD PRESSURE SUPPORT)
PREFILLED_SYRINGE | INTRAVENOUS | Status: DC | PRN
  Administered 2019-09-19: 120 ug via INTRAVENOUS
  Administered 2019-09-19: 40 ug via INTRAVENOUS
  Administered 2019-09-19 (×3): 80 ug via INTRAVENOUS

## 2019-09-19 MED ORDER — SCOPOLAMINE 1 MG/3DAYS TD PT72
MEDICATED_PATCH | TRANSDERMAL | Status: AC
  Filled 2019-09-19: qty 1

## 2019-09-19 MED ORDER — FENTANYL CITRATE (PF) 100 MCG/2ML IJ SOLN
INTRAMUSCULAR | Status: DC | PRN
  Administered 2019-09-19 (×2): 50 ug via INTRAVENOUS

## 2019-09-19 MED ORDER — GABAPENTIN 300 MG PO CAPS
300.0000 mg | ORAL_CAPSULE | ORAL | Status: AC
  Administered 2019-09-19: 300 mg via ORAL

## 2019-09-19 MED ORDER — FENTANYL CITRATE (PF) 100 MCG/2ML IJ SOLN
25.0000 ug | INTRAMUSCULAR | Status: DC | PRN
  Administered 2019-09-19 (×3): 25 ug via INTRAVENOUS

## 2019-09-19 MED ORDER — BUPIVACAINE LIPOSOME 1.3 % IJ SUSP: 20.0000 mL | Freq: Once | INTRAMUSCULAR | Status: DC

## 2019-09-19 MED ORDER — CEFTRIAXONE SODIUM 2 G IJ SOLR
INTRAMUSCULAR | Status: AC
  Filled 2019-09-19: qty 20

## 2019-09-19 MED ORDER — BUPIVACAINE LIPOSOME 1.3 % IJ SUSP
INTRAMUSCULAR | Status: DC | PRN
  Administered 2019-09-19: 20 mL

## 2019-09-19 MED ORDER — CHLORHEXIDINE GLUCONATE CLOTH 2 % EX PADS: 6.0000 | MEDICATED_PAD | Freq: Once | CUTANEOUS | Status: DC

## 2019-09-19 MED ORDER — PROMETHAZINE HCL 25 MG/ML IJ SOLN: 6.2500 mg | INTRAMUSCULAR | Status: DC | PRN

## 2019-09-19 MED ORDER — SODIUM CHLORIDE 0.9 % IV SOLN: INTRAVENOUS | Status: DC

## 2019-09-19 MED ORDER — CELECOXIB 200 MG PO CAPS
ORAL_CAPSULE | ORAL | Status: AC
  Filled 2019-09-19: qty 2

## 2019-09-19 MED ORDER — DEXAMETHASONE SODIUM PHOSPHATE 10 MG/ML IJ SOLN
INTRAMUSCULAR | Status: DC | PRN
  Administered 2019-09-19: 10 mg via INTRAVENOUS

## 2019-09-19 MED ORDER — ONDANSETRON HCL 4 MG/2ML IJ SOLN
INTRAMUSCULAR | Status: AC
  Filled 2019-09-19: qty 2

## 2019-09-19 MED ORDER — OXYCODONE HCL 5 MG PO TABS
ORAL_TABLET | ORAL | Status: AC
  Filled 2019-09-19: qty 1

## 2019-09-19 MED ORDER — OXYCODONE HCL 5 MG PO TABS: 5.0000 mg | ORAL_TABLET | Freq: Four times a day (QID) | ORAL | 0 refills | Status: AC | PRN

## 2019-09-19 MED ORDER — ENSURE PRE-SURGERY PO LIQD: 296.0000 mL | Freq: Once | ORAL | Status: DC

## 2019-09-19 MED ORDER — PROPOFOL 10 MG/ML IV BOLUS
INTRAVENOUS | Status: AC
  Filled 2019-09-19: qty 20

## 2019-09-19 MED ORDER — SODIUM CHLORIDE 0.9 % IV SOLN
2.0000 g | INTRAVENOUS | Status: AC
  Administered 2019-09-19: 2 g via INTRAVENOUS

## 2019-09-19 MED ORDER — FENTANYL CITRATE (PF) 100 MCG/2ML IJ SOLN
INTRAMUSCULAR | Status: AC
  Filled 2019-09-19: qty 2

## 2019-09-19 MED ORDER — MIDAZOLAM HCL 2 MG/2ML IJ SOLN
INTRAMUSCULAR | Status: AC
  Filled 2019-09-19: qty 2

## 2019-09-19 MED ORDER — PHENYLEPHRINE HCL-NACL 10-0.9 MG/250ML-% IV SOLN
INTRAVENOUS | Status: DC | PRN
  Administered 2019-09-19: 40 ug/min via INTRAVENOUS

## 2019-09-19 MED ORDER — CELECOXIB 200 MG PO CAPS
400.0000 mg | ORAL_CAPSULE | ORAL | Status: AC
  Administered 2019-09-19: 400 mg via ORAL

## 2019-09-19 MED ORDER — PHENYLEPHRINE HCL (PRESSORS) 10 MG/ML IV SOLN
INTRAVENOUS | Status: AC
  Filled 2019-09-19: qty 2

## 2019-09-19 MED ORDER — DEXAMETHASONE SODIUM PHOSPHATE 10 MG/ML IJ SOLN
INTRAMUSCULAR | Status: AC
  Filled 2019-09-19: qty 1

## 2019-09-19 MED ORDER — LACTATED RINGERS IV SOLN: INTRAVENOUS | Status: DC

## 2019-09-19 MED ORDER — GABAPENTIN 300 MG PO CAPS
ORAL_CAPSULE | ORAL | Status: AC
  Filled 2019-09-19: qty 1

## 2019-09-19 MED ORDER — LIDOCAINE 2% (20 MG/ML) 5 ML SYRINGE
INTRAMUSCULAR | Status: AC
  Filled 2019-09-19: qty 5

## 2019-09-19 MED ORDER — MEPERIDINE HCL 25 MG/ML IJ SOLN: 6.2500 mg | INTRAMUSCULAR | Status: DC | PRN

## 2019-09-19 MED ORDER — OXYCODONE HCL 5 MG PO TABS
5.0000 mg | ORAL_TABLET | Freq: Once | ORAL | Status: AC | PRN
  Administered 2019-09-19: 5 mg via ORAL

## 2019-09-19 MED ORDER — METRONIDAZOLE IN NACL 5-0.79 MG/ML-% IV SOLN
500.0000 mg | INTRAVENOUS | Status: AC
  Administered 2019-09-19: 500 mg via INTRAVENOUS

## 2019-09-19 MED ORDER — ROCURONIUM BROMIDE 100 MG/10ML IV SOLN
INTRAVENOUS | Status: DC | PRN
  Administered 2019-09-19: 60 mg via INTRAVENOUS

## 2019-09-19 MED ORDER — FENTANYL CITRATE (PF) 250 MCG/5ML IJ SOLN
INTRAMUSCULAR | Status: AC
  Filled 2019-09-19: qty 5

## 2019-09-19 MED ORDER — MIDAZOLAM HCL 5 MG/5ML IJ SOLN
INTRAMUSCULAR | Status: DC | PRN
  Administered 2019-09-19: 2 mg via INTRAVENOUS

## 2019-09-19 MED ORDER — ACETAMINOPHEN 500 MG PO TABS
ORAL_TABLET | ORAL | Status: AC
  Filled 2019-09-19: qty 2

## 2019-09-19 MED ORDER — METRONIDAZOLE IN NACL 5-0.79 MG/ML-% IV SOLN
INTRAVENOUS | Status: AC
  Filled 2019-09-19: qty 100

## 2019-09-19 MED ORDER — ONDANSETRON HCL 4 MG/2ML IJ SOLN
INTRAMUSCULAR | Status: DC | PRN
  Administered 2019-09-19 (×2): 4 mg via INTRAVENOUS

## 2019-09-19 MED ORDER — ACETAMINOPHEN 500 MG PO TABS
1000.0000 mg | ORAL_TABLET | ORAL | Status: AC
  Administered 2019-09-19: 1000 mg via ORAL

## 2019-09-19 MED ORDER — SCOPOLAMINE 1 MG/3DAYS TD PT72
1.0000 | MEDICATED_PATCH | Freq: Once | TRANSDERMAL | Status: DC
  Administered 2019-09-19: 1.5 mg via TRANSDERMAL

## 2019-09-19 MED ORDER — PROPOFOL 10 MG/ML IV BOLUS
INTRAVENOUS | Status: DC | PRN
  Administered 2019-09-19: 150 mg via INTRAVENOUS

## 2019-09-19 MED ORDER — BUPIVACAINE-EPINEPHRINE 0.5% -1:200000 IJ SOLN
INTRAMUSCULAR | Status: DC | PRN
  Administered 2019-09-19: 20 mL

## 2019-09-19 MED ORDER — SODIUM CHLORIDE 0.9 % IV SOLN
INTRAVENOUS | Status: AC
  Filled 2019-09-19: qty 100

## 2019-09-19 MED ORDER — ROCURONIUM BROMIDE 10 MG/ML (PF) SYRINGE
PREFILLED_SYRINGE | INTRAVENOUS | Status: AC
  Filled 2019-09-19: qty 10

## 2019-09-19 MED ORDER — LIDOCAINE 2% (20 MG/ML) 5 ML SYRINGE
INTRAMUSCULAR | Status: DC | PRN
  Administered 2019-09-19: 80 mg via INTRAVENOUS

## 2019-09-19 MED ORDER — EPHEDRINE 5 MG/ML INJ
INTRAVENOUS | Status: AC
  Filled 2019-09-19: qty 10

## 2019-09-19 MED ORDER — EPHEDRINE SULFATE-NACL 50-0.9 MG/10ML-% IV SOSY
PREFILLED_SYRINGE | INTRAVENOUS | Status: DC | PRN
  Administered 2019-09-19 (×3): 10 mg via INTRAVENOUS

## 2019-09-19 MED ORDER — SUGAMMADEX SODIUM 200 MG/2ML IV SOLN
INTRAVENOUS | Status: DC | PRN
  Administered 2019-09-19: 200 mg via INTRAVENOUS

## 2019-09-19 MED ORDER — PHENYLEPHRINE 40 MCG/ML (10ML) SYRINGE FOR IV PUSH (FOR BLOOD PRESSURE SUPPORT)
PREFILLED_SYRINGE | INTRAVENOUS | Status: AC
  Filled 2019-09-19: qty 10

## 2019-09-19 SURGICAL SUPPLY — 53 items
BENZOIN TINCTURE PRP APPL 2/3 (GAUZE/BANDAGES/DRESSINGS) IMPLANT
BLADE CLIPPER SENSICLIP SURGIC (BLADE) IMPLANT
BLADE HEX COATED 2.75 (ELECTRODE) ×3 IMPLANT
BLADE SURG 10 STRL SS (BLADE) IMPLANT
BLADE SURG 15 STRL LF DISP TIS (BLADE) ×1 IMPLANT
BLADE SURG 15 STRL SS (BLADE) ×2
BRIEF STRETCH FOR OB PAD LRG (UNDERPADS AND DIAPERS) ×3 IMPLANT
CANISTER SUCT 1200ML W/VALVE (MISCELLANEOUS) ×3 IMPLANT
COVER BACK TABLE 60X90IN (DRAPES) ×3 IMPLANT
COVER WAND RF STERILE (DRAPES) ×3 IMPLANT
DRAPE HYSTEROSCOPY (DRAPE) ×3 IMPLANT
DRAPE LAPAROTOMY 100X72 PEDS (DRAPES) IMPLANT
DRAPE SHEET LG 3/4 BI-LAMINATE (DRAPES) IMPLANT
DRSG PAD ABDOMINAL 8X10 ST (GAUZE/BANDAGES/DRESSINGS) ×3 IMPLANT
ELECT NEEDLE TIP 2.8 STRL (NEEDLE) IMPLANT
ELECT REM PT RETURN 9FT ADLT (ELECTROSURGICAL) ×3
ELECTRODE REM PT RTRN 9FT ADLT (ELECTROSURGICAL) ×1 IMPLANT
GAUZE SPONGE 4X4 12PLY STRL (GAUZE/BANDAGES/DRESSINGS) ×3 IMPLANT
GLOVE ECLIPSE 8.0 STRL XLNG CF (GLOVE) IMPLANT
GLOVE INDICATOR 8.0 STRL GRN (GLOVE) IMPLANT
GOWN STRL REUS W/TWL XL LVL3 (GOWN DISPOSABLE) ×3 IMPLANT
IV CATH 14GX2 1/4 (CATHETERS) IMPLANT
IV CATH PLACEMENT 20 GA (IV SOLUTION) IMPLANT
KIT SIGMOIDOSCOPE (SET/KITS/TRAYS/PACK) IMPLANT
KIT TURNOVER CYSTO (KITS) ×3 IMPLANT
LEGGING LITHOTOMY PAIR STRL (DRAPES) IMPLANT
LOOP VESSEL MAXI BLUE (MISCELLANEOUS) IMPLANT
NEEDLE HYPO 22GX1.5 SAFETY (NEEDLE) ×3 IMPLANT
PAD PREP 24X48 CUFFED NSTRL (MISCELLANEOUS) ×3 IMPLANT
PENCIL BUTTON HOLSTER BLD 10FT (ELECTRODE) ×3 IMPLANT
SET BASIN DAY SURGERY F.S. (CUSTOM PROCEDURE TRAY) ×3 IMPLANT
SURGILUBE 2OZ TUBE FLIPTOP (MISCELLANEOUS) ×3 IMPLANT
SUT CHROMIC 2 0 SH (SUTURE) ×3 IMPLANT
SUT CHROMIC 3 0 SH 27 (SUTURE) ×3 IMPLANT
SUT ETHIBOND 0 (SUTURE) IMPLANT
SUT MNCRL AB 4-0 PS2 18 (SUTURE) IMPLANT
SUT PROLENE 2 0 SH DA (SUTURE) IMPLANT
SUT VIC AB 2-0 UR6 27 (SUTURE) ×18 IMPLANT
SUT VIC AB 3-0 SH 18 (SUTURE) IMPLANT
SWAB COLLECTION DEVICE MRSA (MISCELLANEOUS) IMPLANT
SWAB CULTURE ESWAB REG 1ML (MISCELLANEOUS) IMPLANT
SYR 20ML LL LF (SYRINGE) ×3 IMPLANT
SYR BULB EAR ULCER 3OZ GRN STR (SYRINGE) ×3 IMPLANT
SYR BULB IRRIG 60ML STRL (SYRINGE) IMPLANT
SYR CONTROL 10ML LL (SYRINGE) IMPLANT
TAPE CLOTH 3X10 TAN LF (GAUZE/BANDAGES/DRESSINGS) ×3 IMPLANT
TOWEL OR 17X26 10 PK STRL BLUE (TOWEL DISPOSABLE) ×6 IMPLANT
TRAY DSU PREP LF (CUSTOM PROCEDURE TRAY) ×3 IMPLANT
TUBE CONNECTING 12'X1/4 (SUCTIONS) ×1
TUBE CONNECTING 12X1/4 (SUCTIONS) ×2 IMPLANT
UNDERPAD 30X30 (UNDERPADS AND DIAPERS) ×3 IMPLANT
WATER STERILE IRR 500ML POUR (IV SOLUTION) ×3 IMPLANT
YANKAUER SUCT BULB TIP NO VENT (SUCTIONS) ×6 IMPLANT

## 2019-09-19 NOTE — Anesthesia Preprocedure Evaluation (Addendum)
Anesthesia Evaluation  Patient identified by MRN, date of birth, ID band Patient awake    Reviewed: Allergy & Precautions, NPO status , Patient's Chart, lab work & pertinent test results, reviewed documented beta blocker date and time   History of Anesthesia Complications (+) PONV and history of anesthetic complications  Airway Mallampati: II  TM Distance: >3 FB Neck ROM: Full    Dental  (+) Teeth Intact, Dental Advisory Given   Pulmonary sleep apnea and Continuous Positive Airway Pressure Ventilation ,    Pulmonary exam normal breath sounds clear to auscultation       Cardiovascular hypertension, Pt. on medications and Pt. on home beta blockers Normal cardiovascular exam Rhythm:Regular Rate:Normal     Neuro/Psych PSYCHIATRIC DISORDERS Anxiety negative neurological ROS     GI/Hepatic Neg liver ROS, GERD  Medicated,  Endo/Other  negative endocrine ROS  Renal/GU negative Renal ROS     Musculoskeletal negative musculoskeletal ROS (+)   Abdominal   Peds  Hematology negative hematology ROS (+)   Anesthesia Other Findings Day of surgery medications reviewed with the patient.  Reproductive/Obstetrics                             Anesthesia Physical Anesthesia Plan  ASA: II  Anesthesia Plan: General   Post-op Pain Management:    Induction: Intravenous  PONV Risk Score and Plan: 3 and Midazolam, Dexamethasone, Ondansetron and Scopolamine patch - Pre-op  Airway Management Planned: Oral ETT  Additional Equipment:   Intra-op Plan:   Post-operative Plan: Extubation in OR  Informed Consent: I have reviewed the patients History and Physical, chart, labs and discussed the procedure including the risks, benefits and alternatives for the proposed anesthesia with the patient or authorized representative who has indicated his/her understanding and acceptance.     Dental advisory given  Plan  Discussed with: CRNA  Anesthesia Plan Comments:        Anesthesia Quick Evaluation

## 2019-09-19 NOTE — Interval H&P Note (Signed)
History and Physical Interval Note:  09/19/2019 8:19 AM  Joshua Collins  has presented today for surgery, with the diagnosis of AIN ANAL POLPS. HEMORRHOIDS WITH BLEEDING.  The various methods of treatment have been discussed with the patient and family. After consideration of risks, benefits and other options for treatment, the patient has consented to  Procedure(s) with comments: ANORECTAL EXAMINATION UNDER ANESTHESIA (N/A) - GENERAL AND LOCAL REMOVAL OF ANAL MASSES, HEMORRHOID LIGATION/PEXY (N/A) as a surgical intervention.  The patient's history has been reviewed, patient examined, no change in status, stable for surgery.  I have reviewed the patient's chart and labs.  Questions were answered to the patient's satisfaction.    I have re-reviewed the the patient's records, history, medications, and allergies.  I have re-examined the patient.  I again discussed intraoperative plans and goals of post-operative recovery.  The patient agrees to proceed.  Joshua Collins  [birth year] [id number]  Patient Care Team: Fanny Bien, MD as PCP - General (Family Medicine) Michael Boston, MD as Consulting Physician (General Surgery) Armbruster, Carlota Raspberry, MD as Consulting Physician (Gastroenterology)  There are no problems to display for this patient.   Past Medical History:  Diagnosis Date   AIN grade I    Allergic rhinitis    Anal polyp    Anxiety    Environmental and seasonal allergies    GERD (gastroesophageal reflux disease)    Hemorrhoids    History of kidney stones    Hypertension    followed by pcp   (09-11-2019 pt stated he thinks he had one done at [facility] approx. 2015 was told ok, no documentation of a stress test in epic or care everywhere only had sleep study)   Nephrolithiasis    09-11-2019  per pt has on renal stone nonobstructive (unilateral)   OSA (obstructive sleep apnea)    per pt cpap  intolerent  (sleep study 06-02-2014 in epic and was seen by dr Dia Sitter 02-05-2018 note in  epic for sleep apnea)   PONV (postoperative nausea and vomiting)     Past Surgical History:  Procedure Laterality Date   EXTRACORPOREAL SHOCK WAVE LITHOTRIPSY Right 03/11/2019   Procedure: EXTRACORPOREAL SHOCK WAVE LITHOTRIPSY (ESWL);  Surgeon: Lucas Mallow, MD;  Location: [facility];  Service: Urology;  Laterality: Right;    Social History   Socioeconomic History   Marital status: Married    Spouse name: [name]   Number of children: 3   Years of education: BSEE   Highest education level: Not on file  Occupational History   Occupation: Teacher, English as a foreign language    Comment: [employer]  Tobacco Use   Smoking status: Never Smoker   Smokeless tobacco: Never Used  Scientific laboratory technician Use: Never used  Substance and Sexual Activity   Alcohol use: No   Drug use: Never   Sexual activity: Not on file  Other Topics Concern   Not on file  Social History Narrative   Consumes 1 cup of caffeine daily   Social Determinants of Health   Financial Resource Strain:    Difficulty of Paying Living Expenses:   Food Insecurity:    Worried About Charity fundraiser in the Last Year:    Arboriculturist in the Last Year:   Transportation Needs:    Film/video editor (Medical):    Lack of Transportation (Non-Medical):   Physical Activity:    Days of Exercise per Week:    Minutes of Exercise  per Session:   Stress:    Feeling of Stress :   Social Connections:    Frequency of Communication with Friends and Family:    Frequency of Social Gatherings with Friends and Family:    Attends Religious Services:    Active Member of Clubs or Organizations:    Attends Music therapist:    Marital Status:   Intimate Partner Violence:    Fear of Current or Ex-Partner:    Emotionally Abused:    Physically Abused:    Sexually Abused:     Family History  Problem Relation Age of Onset   Seizures Mother    Parkinson's disease Father    Hypertension Brother     Heart attack Maternal Uncle    Colon polyps Brother    Colon polyps Brother    Colon cancer Neg Hx    Esophageal cancer Neg Hx    Stomach cancer Neg Hx    Rectal cancer Neg Hx    Liver cancer Neg Hx     Medications Prior to Admission  Medication Sig Dispense Refill Last Dose   amLODipine (NORVASC) 10 MG tablet Take 10 mg by mouth at bedtime.    09/18/2019 at Unknown time   BYSTOLIC 10 MG tablet Take 10 mg by mouth at bedtime.    09/18/2019 at Unknown time   cetirizine (ZYRTEC) 10 MG tablet Take 10 mg by mouth at bedtime.    09/18/2019 at Unknown time   losartan-hydrochlorothiazide (HYZAAR) 100-25 MG tablet Take 1 tablet by mouth at bedtime.    09/18/2019 at Unknown time   montelukast (SINGULAIR) 10 MG tablet Take 10 mg by mouth at bedtime.   09/18/2019 at Unknown time   omeprazole (PRILOSEC) 20 MG capsule Take 20 mg by mouth as needed.   09/18/2019 at Unknown time    Current Facility-Administered Medications  Medication Dose Route Frequency Provider Last Rate Last Admin   0.9 %  sodium chloride infusion   Intravenous Continuous Catalina Gravel, MD 50 mL/hr at 09/19/19 0756 New Bag at 09/19/19 0756   bupivacaine liposome (EXPAREL) 1.3 % injection 266 mg  20 mL Infiltration Once Michael Boston, MD       cefTRIAXone (ROCEPHIN) 2 g in sodium chloride 0.9 % 100 mL IVPB  2 g Intravenous On Call to OR Michael Boston, MD       And   metroNIDAZOLE (FLAGYL) IVPB 500 mg  500 mg Intravenous On Call to OR Michael Boston, MD       Chlorhexidine Gluconate Cloth 2 % PADS 6 each  6 each Topical Once Michael Boston, MD       And   Chlorhexidine Gluconate Cloth 2 % PADS 6 each  6 each Topical Once Michael Boston, MD       Derrill Memo ON 09/20/2019] feeding supplement (ENSURE PRE-SURGERY) liquid 296 mL  296 mL Oral Once Michael Boston, MD         No Known Allergies  BP (!) 139/98   Pulse (!) 59   Temp 98 F (36.7 C) (Oral)   Resp 16   Ht 5' 8.5" (1.74 m)   Wt 86.3 kg   BMI 28.51 kg/m   Labs: Results for  orders placed or performed during the hospital encounter of 09/19/19 (from the past 48 hour(s))  I-STAT, chem 8     Status: Abnormal   Collection Time: 09/19/19  7:56 AM  Result Value Ref Range   Sodium 144 135 - 145 mmol/L  Potassium 3.1 (L) 3.5 - 5.1 mmol/L   Chloride 99 98 - 111 mmol/L   BUN 13 6 - 20 mg/dL   Creatinine, Ser 1.10 0.61 - 1.24 mg/dL   Glucose, Bld 99 70 - 99 mg/dL    Comment: Glucose reference range applies only to samples taken after fasting for at least 8 hours.   Calcium, Ion 1.22 1.15 - 1.40 mmol/L   TCO2 32 22 - 32 mmol/L   Hemoglobin 16.3 13.0 - 17.0 g/dL   HCT 48.0 39 - 52 %    Imaging / Studies: No results found.   [name], M.D., F.A.C.S. Gastrointestinal and Minimally Invasive Surgery [facility] [address] [place], [place] [address] [phone] Main / Paging  09/19/2019 8:19 AM    [name]

## 2019-09-19 NOTE — Transfer of Care (Signed)
Immediate Anesthesia Transfer of Care Note  Patient: Joshua Collins  Procedure(s) Performed: ANORECTAL EXAMINATION UNDER ANESTHESIA (N/A ) REMOVAL OF ANAL MASSES, HEMORRHOID LIGATION/PEXY (N/A )  Patient Location: PACU  Anesthesia Type:General  Level of Consciousness: drowsy and patient cooperative  Airway & Oxygen Therapy: Patient Spontanous Breathing and Patient connected to face mask oxygen  Post-op Assessment: Report given to RN and Post -op Vital signs reviewed and stable  Post vital signs: Reviewed and stable  Last Vitals:  Vitals Value Taken Time  BP 127/74 09/19/19 1048  Temp    Pulse 61 09/19/19 1054  Resp 13 09/19/19 1054  SpO2 100 % 09/19/19 1054  Vitals shown include unvalidated device data.  Last Pain:  Vitals:   09/19/19 0733  TempSrc: Oral  PainSc: 0-No pain      Patients Stated Pain Goal: 2 (38/37/79 3968)  Complications: No complications documented.

## 2019-09-19 NOTE — Anesthesia Procedure Notes (Signed)
Procedure Name: Intubation Date/Time: 09/19/2019 9:36 AM Performed by: Gwyndolyn Saxon, CRNA Pre-anesthesia Checklist: Patient identified, Emergency Drugs available, Suction available and Patient being monitored Patient Re-evaluated:Patient Re-evaluated prior to induction Oxygen Delivery Method: Circle system utilized Preoxygenation: Pre-oxygenation with 100% oxygen Induction Type: IV induction Ventilation: Mask ventilation without difficulty Laryngoscope Size: Miller and 2 Grade View: Grade III Tube type: Oral Tube size: 7.0 mm Number of attempts: 2 Airway Equipment and Method: Patient positioned with wedge pillow and Bougie stylet Placement Confirmation: positive ETCO2 and breath sounds checked- equal and bilateral Secured at: 20 cm Tube secured with: Tape Dental Injury: Teeth and Oropharynx as per pre-operative assessment  Comments: Anterior larynx; ETT taped at 20; unable to advance further. [facility]. Cuff felt at sternal notch.

## 2019-09-19 NOTE — Anesthesia Postprocedure Evaluation (Signed)
Anesthesia Post Note  Patient: Joshua Collins  Procedure(s) Performed: ANORECTAL EXAMINATION UNDER ANESTHESIA (N/A ) REMOVAL OF ANAL MASSES, HEMORRHOID LIGATION/PEXY (N/A )     Patient location during evaluation: PACU Anesthesia Type: General Level of consciousness: awake and alert Pain management: pain level controlled Vital Signs Assessment: post-procedure vital signs reviewed and stable Respiratory status: spontaneous breathing, nonlabored ventilation, respiratory function stable and patient connected to nasal cannula oxygen Cardiovascular status: blood pressure returned to baseline and stable Postop Assessment: no apparent nausea or vomiting Anesthetic complications: no   No complications documented.  Last Vitals:  Vitals:   09/19/19 1130 09/19/19 1240  BP: 131/87 106/73  Pulse: 62 (!) 58  Resp: (!) 8 16  Temp:  36.9 C  SpO2: 97% 100%    Last Pain:  Vitals:   09/19/19 1240  TempSrc:   PainSc: 4                  Catalina Gravel

## 2019-09-19 NOTE — Discharge Instructions (Signed)
ANORECTAL SURGERY:  POST OPERATIVE INSTRUCTIONS  ######################################################################  EAT Start with a pureed / full liquid diet After 24 hours, gradually transition to a high fiber diet.    CONTROL PAIN Control pain so you can tolerate bowel movements,  walk, sleep, tolerate sneezing/coughing, and go up/down stairs.   HAVE A BOWEL MOVEMENT DAILY Keep your bowels regular to avoid problems.   Taking a fiber supplement every day to keep bowels soft.   Try a laxative to override constipation. Use an antidairrheal to slow down diarrhea.   Call if not better after 2 tries  WALK Walk an hour a day.  Control your pain to do that.   CALL IF YOU HAVE PROBLEMS/CONCERNS Call if you are still struggling despite following these instructions. Call if you have concerns not answered by these instructions  ######################################################################    1. Take your usually prescribed home medications unless otherwise directed.  2. DIET: Follow a light bland diet & liquids the first 24 hours after arrival home, such as soup, liquids, starches, etc.  Be sure to drink plenty of fluids.  Quickly advance to a usual solid diet within a few days.  Avoid fast food or heavy meals as your are more likely to get nauseated or have irregular bowels.  A low-fat, high-fiber diet for the rest of your life is ideal.  3. PAIN CONTROL: a. Pain is best controlled by a usual combination of three different methods TOGETHER: i. Ice/Heat ii. Over the counter pain medication iii. Prescription pain medication b. Expect swelling and discomfort in the anus/rectal area.  Warm water baths (30-60 minutes up to 6 times a day, especially after bowel meovements) will help. Use ice for the first few days to help decrease swelling and bruising, then switch to heat such as warm towels, sitz baths, warm baths, etc to help relax tight/sore spots and speed recovery.   Some people prefer to use ice alone, heat alone, alternating between ice & heat.  Experiment to what works for you.   c. It is helpful to take an over-the-counter pain medication continuously for the first few weeks.  Choose one of the following that works best for you: i. Naproxen (Aleve, etc)  Two 250m tabs twice a day ii. Ibuprofen (Advil, etc) Three 2072mtabs four times a day (every meal & bedtime) iii. Acetaminophen (Tylenol, etc) 500-65044mour times a day (every meal & bedtime) d. A  prescription for pain medication (such as oxycodone, hydrocodone, etc) should be given to you upon discharge.  Take your pain medication as prescribed.  i. If you are having problems/concerns with the prescription medicine (does not control pain, nausea, vomiting, rash, itching, etc), please call us [phone] see if we need to switch you to a different pain medicine that will work better for you and/or control your side effect better. ii. If you need a refill on your pain medication, please contact your pharmacy.  They will contact our office to request authorization. Prescriptions will not be filled after 5 pm or on week-ends.  If can take up to 48 hours for it to be filled & ready so avoid waiting until you are down to thel ast pill. e. A topical cream (Dibucaine) or a prescription for a cream (such as diltiazem 2% gel) may be given to you.  Many people find relief with topical creams.  Some people find it burns too much.  Experiment.  If it helps, use it.  If it burns, don't using  it.  Use a Sitz Bath 4-8 times a day for relief   [employer] A sitz bath is a warm water bath taken in the sitting position that covers only the hips and buttocks. It may be used for either healing or hygiene purposes. Sitz baths are also used to relieve pain, itching, or muscle spasms. The water may contain medicine. Moist heat will help you heal and relax.  HOME CARE INSTRUCTIONS  Take 3 to 4 sitz baths a day. 1. Fill the  bathtub half full with warm water. 2. Sit in the water and open the drain a little. 3. Turn on the warm water to keep the tub half full. Keep the water running constantly. 4. Soak in the water for 15 to 20 minutes. 5. After the sitz bath, pat the affected area dry first.   4. KEEP YOUR BOWELS REGULAR a. The goal is one soft bowel movement a day b. Avoid getting constipated.  Between the surgery and the pain medications, it is common to experience some constipation.  Increasing fluid intake and taking a fiber supplement (such as Metamucil, Citrucel, FiberCon, MiraLax, etc) 2-3 times a day regularly will usually help prevent this problem from occurring.  A mild laxative (prune juice, Milk of Magnesia, MiraLax, etc) should be taken according to package directions if there are no bowel movements after 48 hours. c. Watch out for diarrhea.  If you have many loose bowel movements, simplify your diet to bland foods & liquids for a few days.  Stop any stool softeners and decrease your fiber supplement.  Switching to mild anti-diarrheal medications (Kayopectate, Pepto Bismol) can help.  Can try an imodium/loperamide dose.  If this worsens or does not improve, please call us.  5. Wound Care  a. Remove your bandages with your first bowel movement, usually the day after surgery.  You may have packing if you had an abscess.  Let any packing or gauze fall come out.   b. Wear an absorbent pad or soft cotton balls in your underwear as needed to catch any drainage and help keep the area  c. Keep the area clean and dry.  Bathe / shower every day.  Keep the area clean by showering / bathing over the incision / wound.   It is okay to soak an open wound to help wash it.  Consider using a squeeze bottle filled with warm water to gently wash the anal area.  Wet wipes or showers / gentle washing after bowel movements is often less traumatic than regular toilet paper. d. Dennis Bast will often notice bleeding with bowel movements.   This should slow down by the end of the first week of surgery.  Sitting on an ice pack can help. e. Expect some drainage.  This should slow down by the end of the first week of surgery, but you will have occasional bleeding or drainage up to a few months after surgery.  Wear an absorbent pad or soft cotton gauze in your underwear until the drainage stops.  6. ACTIVITIES as tolerated:   a. You may resume regular (light) daily activities beginning the next day--such as daily self-care, walking, climbing stairs--gradually increasing activities as tolerated.  If you can walk 30 minutes without difficulty, it is safe to try more intense activity such as jogging, treadmill, bicycling, low-impact aerobics, swimming, etc. b. Save the most intensive and strenuous activity for last such as sit-ups, heavy lifting, contact sports, etc  Refrain from any heavy lifting or straining  until you are off narcotics for pain control.   c. DO NOT PUSH THROUGH PAIN.  Let pain be your guide: If it hurts to do something, don't do it.  Pain is your body warning you to avoid that activity for another week until the pain goes down. d. You may drive when you are no longer taking prescription pain medication, you can comfortably sit for long periods of time, and you can safely maneuver your car and apply brakes. e. Dennis Bast may have sexual intercourse when it is comfortable.  7. FOLLOW UP in our office a. Please call [facility] at [phone] to set up an appointment to see your surgeon in the office for a follow-up appointment approximately 2-3 weeks after your surgery. b. Make sure that you call for this appointment the day you arrive home to ensure a convenient appointment time.  8. IF YOU HAVE DISABILITY OR FAMILY LEAVE FORMS, BRING THEM TO THE OFFICE FOR PROCESSING.  DO NOT GIVE THEM TO YOUR DOCTOR.   WHEN TO CALL us [phone]: 1. Poor pain control 2. Reactions / problems with new medications (rash/itching, nausea, etc)   3. Fever over 101.5 F (38.5 C) 4. Inability to urinate 5. Nausea and/or vomiting 6. Worsening swelling or bruising 7. Continued bleeding from incision. 8. Increased pain, redness, or drainage from the incision  The clinic staff is available to answer your questions during regular business hours (8:30am-5pm).  Please don't hesitate to call and ask to speak to one of our nurses for clinical concerns.   A surgeon from [facility] is always on call at the hospitals   If you have a medical emergency, go to the nearest emergency room or call 911.    [facility], [place], [place], [place], [place]  [address] ? MAIN: [phone] ? TOLL FREE: [phone] ? FAX [phone] [email or web address]   HEMORRHOIDS   Hemorrhoidal piles are natural clusters of blood vessels that help the rectum and anal canal stretch to hold stool and allow bowel movements.  Most people will develop a flare of hemorrhoids in their lifetime.  When hemorrhoidals are irritated, they can swell, burn, itch, cause pain, and bleed.  Most flares will calm down gradually within a few weeks.  However, once hemorrhoids are created, they tend to flare more easily.  Fortunately, good habits and simple medical treatment usually control hemorrhoids well, and surgery is needed only in severe cases.  TREATMENT OF HEMORRHOID FLARE Warm soaks. 4-8 times a day This helps more than any topical medication.   1. A sitz bath is a warm water bath taken in the sitting position that covers only the hips and buttocks.Fill the bathtub half full with warm water. 2. Soak in the water for 15 to 30 minutes. 3. After the sitz bath, pat the affected area dry first.  Normalize your bowels.  Extremes of diarrhea or constipation will make hemorrhoids worse.  One soft bowel movement a day is the goal.   Wet wipes instead of toilet paper Pain control with a NSAID such as ibuprofen (Advil) or  naproxen (Aleve) or acetaminophen (Tylenol) around the clock.  Narcotics are constipating and should be minimized if possible Topical creams contain steroids (bydrocortisone) or local anesthetic (xylocaine) can help make pain and itching more tolerable.    TROUBLESHOOTING IRREGULAR BOWELS 1) Avoid extremes of bowel movements (no bad constipation/diarrhea) 2) Miralax 17gm in 8oz. water or juice every day. May use twice a day.  3) Gas-x or Phazyme as needed for gas & bloating.  4) Soft & bland diet. No spicy, greasy, or fried foods.  5) Omeprazole over-the-counter as needed  6) May hold gluten/wheat products from diet to see if symptoms improve.  7)  May try probiotics (Align, Activa, etc) to help calm the bowels down 7) If symptoms become worse: Call back immediately.  Information for Discharge Teaching: EXPAREL (bupivacaine liposome injectable suspension)   Your surgeon or anesthesiologist gave you EXPAREL(bupivacaine) to help control your pain after surgery.   EXPAREL is a local anesthetic that provides pain relief by numbing the tissue around the surgical site.  EXPAREL is designed to release pain medication over time and can control pain for up to 72 hours.  Depending on how you respond to EXPAREL, you may require less pain medication during your recovery.  Possible side effects:  Temporary loss of sensation or ability to move in the area where bupivacaine was injected.  Nausea, vomiting, constipation  Rarely, numbness and tingling in your mouth or lips, lightheadedness, or anxiety may occur.  Call your doctor right away if you think you may be experiencing any of these sensations, or if you have other questions regarding possible side effects.  Follow all other discharge instructions given to you by your surgeon or nurse. Eat a healthy diet and drink plenty of water or other fluids.  If you return to the hospital for any reason within 96 hours following the administration of  EXPAREL, it is important for health care providers to know that you have received this anesthetic. A teal colored band has been placed on your arm with the date, time and amount of EXPAREL you have received in order to alert and inform your health care providers. Please leave this armband in place for the full 96 hours following administration, and then you may remove the band.  Post Anesthesia Home Care Instructions  Activity: Get plenty of rest for the remainder of the day. A responsible individual must stay with you for 24 hours following the procedure.  For the next 24 hours, DO NOT: -Drive a car -Paediatric nurse -Drink alcoholic beverages -Take any medication unless instructed by your physician -Make any legal decisions or sign important papers.  Meals: Start with liquid foods such as gelatin or soup. Progress to regular foods as tolerated. Avoid greasy, spicy, heavy foods. If nausea and/or vomiting occur, drink only clear liquids until the nausea and/or vomiting subsides. Call your physician if vomiting continues.  Special Instructions/Symptoms: Your throat may feel dry or sore from the anesthesia or the breathing tube placed in your throat during surgery. If this causes discomfort, gargle with warm salt water. The discomfort should disappear within 24 hours.  If you had a scopolamine patch placed behind your ear for the management of post- operative nausea and/or vomiting:  1. The medication in the patch is effective for 72 hours, after which it should be removed.  Wrap patch in a tissue and discard in the trash. Wash hands thoroughly with soap and water. 2. You may remove the patch earlier than 72 hours if you experience unpleasant side effects which may include dry mouth, dizziness or visual disturbances. 3. Avoid touching the patch. Wash your hands with soap and water after contact with the patch.

## 2019-09-19 NOTE — H&P (Signed)
Joshua Collins DOB: [birth year]   Patient Care Team: Joshua Bien, MD as PCP - General (Family Medicine) Joshua Boston, MD as Consulting Physician (General Surgery) Collins, Joshua Raspberry, MD as Consulting Physician (Gastroenterology)  ` Patient sent for surgical consultation at the request of Dr. Lafe Collins  Chief Complaint: Persistent anal polyps now AIN1. Re-Reconsideration of surgery.  Patient had noted change in bowel habits and was concerned about change in caliber of stools. Underwent colonoscopy. Prolapsing anal crypt polyps noted. Surgery recommended. I saw him in early 2018. I offered surgery. He declined. He came in a year later. I again offered surgery. He did not schedule this. 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 Surgical consultation again recommended.  Pt ready to consider surgery   PRIOR NOTES: Patient with history of perianal complaints. Colonoscopy revealed some squamous anal polyps. Surgical consultation requested. I saw him over a year ago. I offered examination under anesthesia with removal of anal polyps. This did not happen. He returns a year later. He notes he had a lot of family and work stresses, so he tried to hold off on any intervention. It is not been particularly bothersome but he still notes the change in caliber of stools. Occasional blood. Occasionally irritation. Recently had a hemorrhoid flare with some irregular bowels. He has a few months this year that are potentially more quiet to allow surgery to happen. He is worried about a possible cancer given pain friend that died of colon cancer and brother who has polyps.  History of hemorrhoid problems in the past. Bowels usually under better control with ~2 bowel movements a day well-formed. Had some intermittent lumps popping out and bleeding and irritation. Family history of  colon polyps in his brothers. Underwent colonoscopy. Small polyp removed approximately. Had a few polyps at the anal verge. Recommended surgical evaluation and probable removal. Patient notes he got rash with the bowel prep concerning for cellulitis. He is due to see a dermatologist. An area of rash in his left armpit as well. No history of eczema or psoriasis that he knows of. His never had abdominal surgery. No prior anorectal interventions. He does not smoke. 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 He can walk a few miles without difficulty. He had bad hemorrhoid flares sitting on a donut and eating Preparation H but notes he's not had any recently. No thrombosed hemorrhoids or interventions.  No personal nor family history of GI/colon cancer, inflammatory bowel disease, irritable bowel syndrome, allergy such as Celiac Sprue, dietary/dairy problems, colitis, ulcers nor gastritis. No recent sick contacts/gastroenteritis. No travel outside the country. No changes in diet. No dysphagia to solids or liquids. No significant heartburn or reflux. No hematochezia, hematemesis, coffee ground emesis. No evidence of prior gastric/peptic ulceration.  (Review of systems as stated in this history (HPI) or in the review of systems. Otherwise all other 12 point ROS are negative)   Problem List/Past Medical Joshua Hector, MD; 07/22/2019 9:10 AM) ANAL POLYP (K62.0) ENCOUNTER FOR PREOPERATIVE EXAMINATION FOR GENERAL SURGICAL PROCEDURE (Z01.818) PROLAPSED INTERNAL HEMORRHOIDS, GRADE 2 (K64.1)  Past Surgical History Joshua Hector, MD; 07/22/2019 9:10 AM) Colon Polyp Removal - Colonoscopy  Diagnostic Studies History Joshua Hector, MD; 07/22/2019 9:10 AM) Colonoscopy within last year  Allergies (Joshua Collins, [place]; 07/22/2019 8:37 AM) No Known Drug Allergies [03/31/2016]: Allergies Reconciled  Medication History (Joshua Collins, RMA; 5/64/3329 5:18 AM) Bystolic  (20MG   Tablet, Oral daily) Active. Losartan Potassium-HCTZ (100-25MG  Tablet, Oral daily) Active. amLODIPine Besylate (10MG  Tablet, Oral) Active. Medications Reconciled  Social History Joshua Hector, MD; 07/22/2019 9:10 AM) Alcohol use Occasional alcohol use. Caffeine use Coffee. No drug use Tobacco use Never smoker.  Family History Joshua Hector, MD; 07/22/2019 9:10 AM) Hypertension Brother.  Other Problems Joshua Hector, MD; 07/22/2019 9:10 AM) High blood pressure Kidney Stone     Review of Systems Joshua Hector, MD; 07/22/2019 9:10 AM) General Not Present- Appetite Loss, Chills, Fatigue, Fever, Night Sweats, Weight Gain and Weight Loss. Skin Not Present- Change in Wart/Mole, Dryness, Hives, Jaundice, New Lesions, Non-Healing Wounds, Rash and Ulcer. HEENT Not Present- Earache, Hearing Loss, Hoarseness, Nose Bleed, Oral Ulcers, Ringing in the Ears, Seasonal Allergies, Sinus Pain, Sore Throat, Visual Disturbances, Wears glasses/contact lenses and Yellow Eyes. Respiratory Present- Snoring. Not Present- Bloody sputum, Chronic Cough, Difficulty Breathing and Wheezing. Breast Not Present- Breast Mass, Breast Pain, Nipple Discharge and Skin Changes. Cardiovascular Not Present- Chest Pain, Difficulty Breathing Lying Down, Leg Cramps, Palpitations, Rapid Heart Rate, Shortness of Breath and Swelling of Extremities. Gastrointestinal Not Present- Abdominal Pain, Bloating, Bloody Stool, Change in Bowel Habits, Chronic diarrhea, Constipation, Difficulty Swallowing, Excessive gas, Gets full quickly at meals, Hemorrhoids, Indigestion, Nausea, Rectal Pain and Vomiting. Male Genitourinary Not Present- Blood in Urine, Change in Urinary Stream, Frequency, Impotence, Nocturia, Painful Urination, Urgency and Urine Leakage. Musculoskeletal Not Present- Back Pain, Joint Pain, Joint Stiffness, Muscle Pain, Muscle Weakness and Swelling of Extremities. Neurological Not Present-  Decreased Memory, Fainting, Headaches, Numbness, Seizures, Tingling, Tremor, Trouble walking and Weakness. Psychiatric Not Present- Anxiety, Bipolar, Change in Sleep Pattern, Depression, Fearful and Frequent crying. Endocrine Not Present- Cold Intolerance, Excessive Hunger, Hair Changes, Heat Intolerance and New Diabetes. Hematology Not Present- Blood Thinners, Easy Bruising, Excessive bleeding, Gland problems, HIV and Persistent Infections.  Vitals (Joshua A. Brown RMA; 07/22/2019 8:37 AM) 07/22/2019 8:37 AM Weight: 198.2 lb Height: 68in Body Surface Area: 2.04 m Body Mass Index: 30.14 kg/m  Temp.: 97.55F  Pulse: 67 (Regular)  BP: 128/74(Sitting, Left Arm, Standard)        Physical Exam Joshua Hector MD; 07/22/2019 9:25 AM)  General Mental Status-Alert. General Appearance-Not in acute distress. Voice-Normal. Note: Relaxed. Nontoxic.  Integumentary Global Assessment Upon inspection and palpation of skin surfaces of the - Distribution of scalp and body hair is normal. General Characteristics Overall examination of the patient's skin reveals - no rashes and no suspicious lesions.  Head and Neck Head-normocephalic, atraumatic with no lesions or palpable masses. Face Global Assessment - atraumatic, no absence of expression. Neck Global Assessment - no abnormal movements, no decreased range of motion. Trachea-midline. Thyroid Gland Characteristics - non-tender.  Eye Eyeball - Left-Extraocular movements intact, No Nystagmus - Left. Eyeball - Right-Extraocular movements intact, No Nystagmus - Right. Upper Eyelid - Left-No Cyanotic - Left. Upper Eyelid - Right-No Cyanotic - Right.  Chest and Lung Exam Inspection Accessory muscles - No use of accessory muscles in breathing.  Abdomen Note: Abdomen soft. Nontender, nondistended. No guarding. No diastasis. No umbilical nor other hernias  Male Genitourinary Note: No  inguinal hernias. Normal external genitalia. Epididymi, testes, and spermatic cords normal without any masses.  Rectal Note: ` ` ` Very sensitive/guarded but consolable. Pedunculated polyps felt at anal verge. Right posterior pedunculated at least 2 cm. Almost comes out of the anal canal Most likely consistent with hypertrophic anal polyps versus chronic hemorrhoids. Does feel more fibrotic consistent with AIN changes. However  mobile. He is not able to tolerate anoscopy was, so I held off.  Perianal skin clean with good hygiene. No pruritis ani. No pilonidal disease. No fissure. No abscess/fistula. Normal sphincter tone.  No external hemorrhoids. No condyloma warts. No other rectal masses. Prostate normal size and smooth.  Peripheral Vascular Upper Extremity Inspection - Left - Not Gangrenous, No Petechiae. Inspection - Right - Not Gangrenous, No Petechiae.  Neurologic Neurologic evaluation reveals -normal attention span and ability to concentrate, able to name objects and repeat phrases. Appropriate fund of knowledge and normal coordination.  Neuropsychiatric Mental status exam performed with findings of-able to articulate well with normal speech/language, rate, volume and coherence and no evidence of hallucinations, delusions, obsessions or homicidal/suicidal ideation. Orientation-oriented X3.  Musculoskeletal Global Assessment Gait and Station - normal gait and station.  Lymphatic General Lymphatics Description - No Generalized lymphadenopathy.    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 Most likely some AIN transformation of a chronic anal crypt. I don't think he truly has new condylomata.  Regardless, I think he would benefit from examination under anesthesia with removal masses. Outpatient surgery. He is ready to consider. He is hoping to get this done in  June.  Current Plans Pt Education - [facility] Anal Warts / AIN Dysplasia (AT)  ANAL POLYP (K62.0) Impression: Pedunculated anal masses most likely hypertrophic papillae in the setting of grade 2 internal hemorrhoids. Causing intermittent bleeding and irritation but not to the point of fully prolapsing yet. Now with AIN changes. He is ready to re-reconsider surgery    The anatomy & physiology of the anorectal region was discussed. The pathophysiology of anorectal warts and differential diagnosis was discussed. Natural history risks without surgery was discussed such as further growth and cancer. I stressed the importance of office follow-up to catch early recurrence & minimize/halt progression of disease. Interventions such as cauterization or cryotherapy by topical agents were discussed.  The patient's symptoms are not adequately controlled by non-operative treatments. I feel the risks & problems of no surgery outweigh the operative risks; therefore, I recommended surgery to treat the anal warts by removal, ablation and/or cauterization.  Risks such as bleeding, infection, need for further treatment, heart attack, death, and other risks were discussed. I noted a good likelihood this will help address the problem. Goals of post-operative recovery were discussed as well. Possibility that this will not correct all symptoms was explained. Post-operative pain, bleeding, constipation, and other problems after surgery were discussed. We will work to minimize complications. Educational handouts further explaining the pathology, treatment options, and bowel regimen were given as well. Questions were answered. The patient expresses understanding & wishes to proceed with surgery.    PROLAPSED INTERNAL HEMORRHOIDS, GRADE 2 (K64.1) Impression: May benefit from hemorrhoidal ligation at the same time. We'll determine how the anatomy is once he is asleep and I can get a better examination under  anesthesia  Current Plans Pt Education - [facility] Hemorrhoids (Kenly Xiao): discussed with patient and provided information.  Joshua Hector, MD, FACS, MASCRS Gastrointestinal and Minimally Invasive Surgery  [facility] [address], [place],  [address] [phone] Main / Paging [phone] Fax

## 2019-09-19 NOTE — Op Note (Signed)
09/19/2019  10:30 AM  PATIENT:  Joshua Collins  49 y.o. male  Patient Care Team: Fanny Bien, MD as PCP - General (Family Medicine) Michael Boston, MD as Consulting Physician (General Surgery) Armbruster, Carlota Raspberry, MD as Consulting Physician (Gastroenterology)  PRE-OPERATIVE DIAGNOSIS:  ANAL POLYP WITH AIN . HEMORRHOIDS WITH BLEEDING  POST-OPERATIVE DIAGNOSIS:   ANAL POLYP WITH AIN HEMORRHOIDS WITH BLEEDING  PROCEDURE:   Excision of prolapsing anal polyp  x1 Internal hemorrhoidal ligation and pexy Anorectal examination under anesthesia  SURGEON:  [name], MD  ANESTHESIA:   General Anorectal & Local field block (0.25% bupivacaine with epinephrine mixed with Liposomal bupivacaine (Experel)   EBL:  Total I/O In: 200 [IV Piggyback:200] Out: 5 [Blood:5].  See operative record  Delay start of Pharmacological VTE agent (>24hrs) due to surgical blood loss or risk of bleeding:  NO  DRAINS: NONE  SPECIMEN:   Anal crypt polyp (right posterior)  DISPOSITION OF SPECIMEN:  PATHOLOGY  COUNTS:  YES  PLAN OF CARE: Discharge home after PACU  PATIENT DISPOSITION:  PACU - hemodynamically stable.  INDICATION: Pleasant patient with struggles with hemorrhoids.  Found to have anal polyp.  Larger & prolapsing with AIN changes.  Not able to be managed in the office despite an improved bowel regimen.  I recommended examination under anesthesia and surgical treatment:  The anatomy & physiology of the anorectal region was discussed.  The pathophysiology of hemorrhoids and differential diagnosis was discussed.  Natural history risks without surgery was discussed.   I stressed the importance of a bowel regimen to have daily soft bowel movements to minimize progression of disease.  Interventions such as sclerotherapy & banding were discussed.  The patient's symptoms are not adequately controlled by medicines and other non-operative treatments.  I feel the risks & problems of no  surgery outweigh the operative risks; therefore, I recommended surgery to treat the hemorrhoids by ligation, pexy, and possible resection.  Risks such as bleeding, infection, need for further treatment, heart attack, death, and other risks were discussed.   I noted a good likelihood this will help address the problem.  Goals of post-operative recovery were discussed as well.  Possibility that this will not correct all symptoms was explained.  Post-operative pain, bleeding, constipation, urinary difficulties, and other problems after surgery were discussed.  We will work to minimize complications.   Educational handouts further explaining the pathology, treatment options, and bowel regimen were given as well.  Questions were answered.  The patient expresses understanding & wishes to proceed with surgery.  OR FINDINGS: Right posterior prolapsing anal crypt polyp - excised.  No evidence of any anal intestinal neoplasia elsewhere.  No scarring or other changes.  Grade 2 internal hemorrhoids with some irritation inflammation.  Ligation pexy done all 6 columns/3 piles  DESCRIPTION:   Informed consent was confirmed. Patient underwent general anesthesia without difficulty. Patient was placed into prone positioning.  The perianal region was prepped and draped in sterile fashion. Surgical time-out confirmed our plan.  I did digital rectal examination and then transitioned over to anoscopy to get a sense of the anatomy.  Findings noted above.   Focused on the right posterior anal crypt polyp on top of the right posterior hemorrhoidal pile.  I proceeded to do hemorrhoidal ligation and pexy.  I used a 2-0 Vicryl suture on a UR-6 needle in a figure-of-eight fashion 6 cm proximal to the anal verge.   I started at the largest hemorrhoid pile.   I  excised the pedunculated anal crypt polyp that had AIN within it in a fusiform biconcave fashion, at the right posterior location, sparing the anal canal to avoid narrowing.   I then ran that stitch longitudinally more distally to close the hemorrhoidectomy wound to the anal verge over a Parks self retaining retractor to avoid narrowing of the anal canal.  I then tied that stitch down to cause a hemorrhoidopexy.   I then did hemorrhoidal ligation and pexy at the other 5 columns.  At the completion of this, all 6 anorectal columns were ligated and pexied in the classic hexagonal fashion (right anterior/lateral/posterior, left anterior/lateral/posterior).   I redid anoscopy & examination.  At completion of this, all hemorrhoids had been removed or reduced into the rectum.  There is no more prolapse.  Internal & external anatomy was more more normal.  Hemostasis was good.  Fluffed gauze was on-laid over the perianal region.  No packing done.  Patient is being extubated go to go to the recovery room.  I had discussed postop care in detail with the patient in the preop holding area.  Instructions for post-operative recovery and prescriptions are written. I discussed operative findings, updated the patient's status, discussed probable steps to recovery, and gave postoperative recommendations to the patient's spouse, Joshua Collins.  Recommendations were made.  Questions were answered.  She expressed understanding & appreciation.  [name], M.D., F.A.C.S. Gastrointestinal and Minimally Invasive Surgery [facility] [address] [place], [place] [address] [phone] Main / Paging

## 2019-09-20 ENCOUNTER — Encounter (HOSPITAL_BASED_OUTPATIENT_CLINIC_OR_DEPARTMENT_OTHER): Payer: Self-pay | Admitting: Surgery

## 2019-09-20 LAB — SURGICAL PATHOLOGY

## 2020-03-17 IMAGING — MR MR SHOULDER*R* W/O CM
4 of 5 series · 29 of 40 positions shown · non-contrast
Comparison: Radiograph 08/28/2014

CLINICAL DATA: Right shoulder pain for 2 months. Possible gym
injury.

EXAM:
MRI OF THE RIGHT SHOULDER WITHOUT CONTRAST
TECHNIQUE: Multiplanar, multisequence MR imaging of the shoulder was performed.
No intravenous contrast was administered.

[Series 4: PD fat-sat · axial · 4.0mm · 0.27mm/px · z∈[-44,+40]mm · 8 of 19 slices shown (1 of 2)]
[im 1/19]
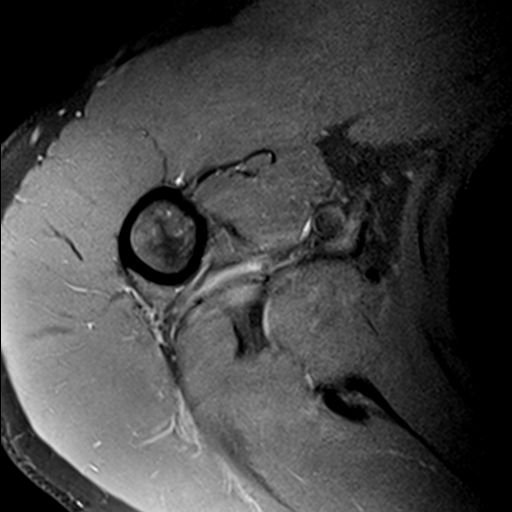
[im 3/19]
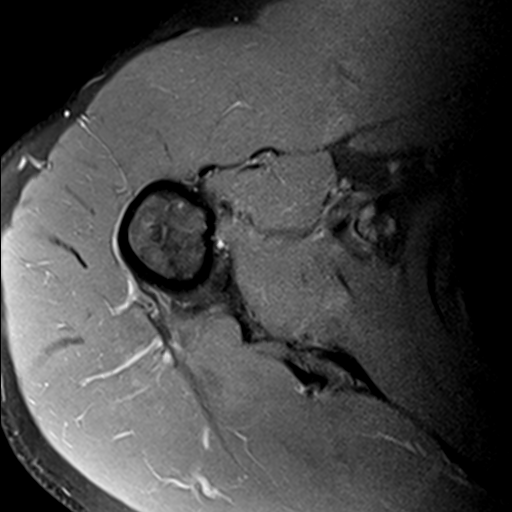
[im 7/19]
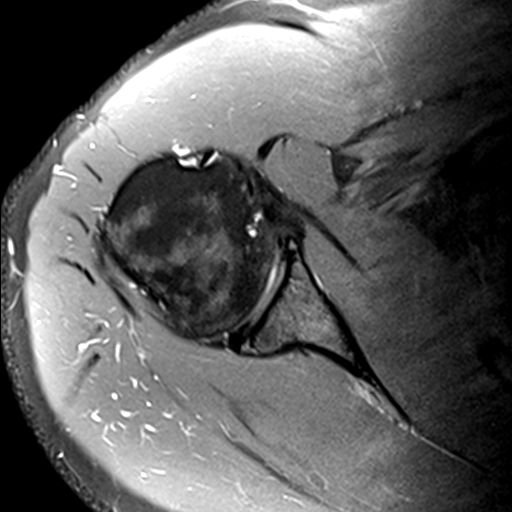
[im 9/19]
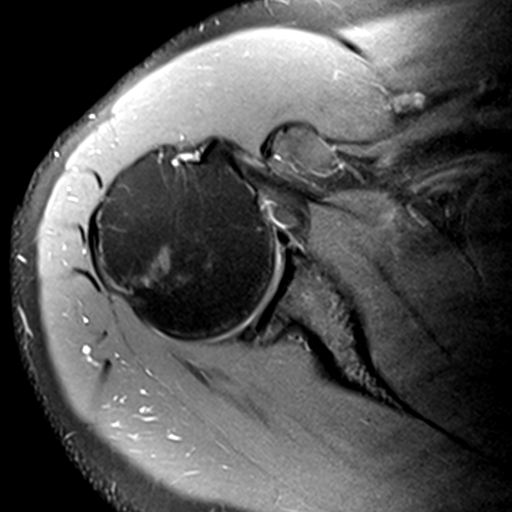
[im 11/19]
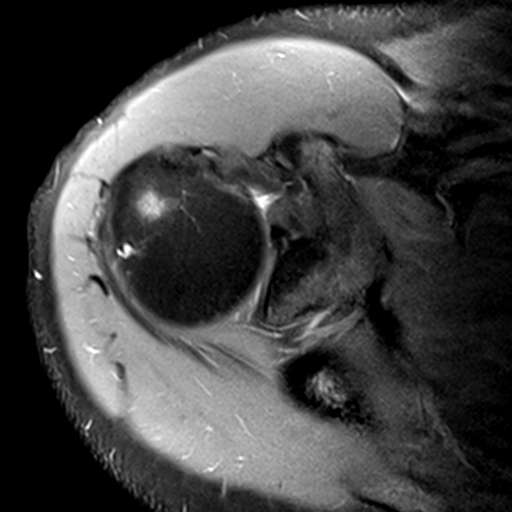
[im 13/19]
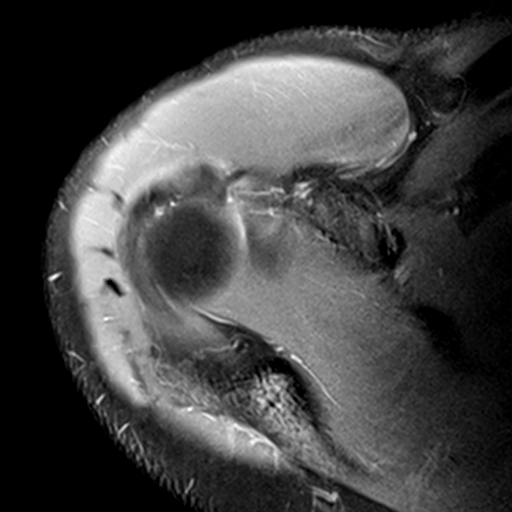
[im 17/19]
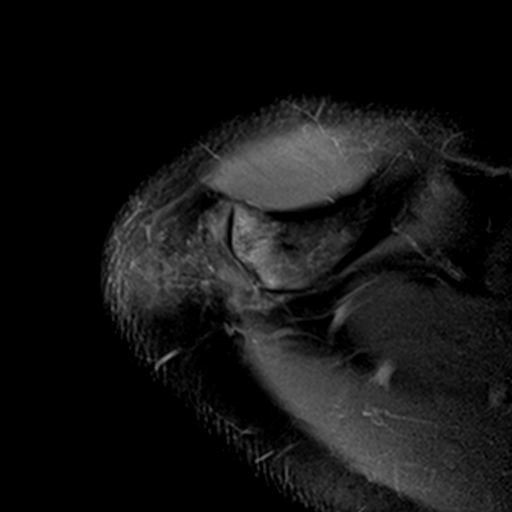
[im 19/19]
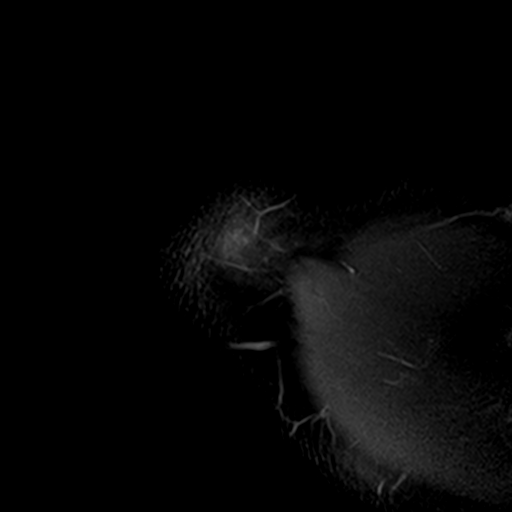

[Series 5: T2 fat-sat · oblique · 4.0mm · 0.55mm/px · 7 of 15 slices shown (1 of 2)]
[im 1/15]
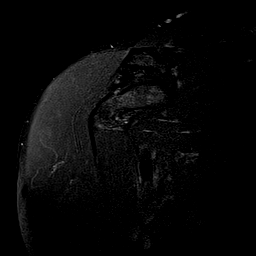
[im 3/15]
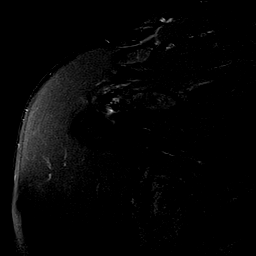
[im 5/15]
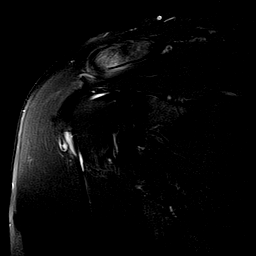
[im 8/15]
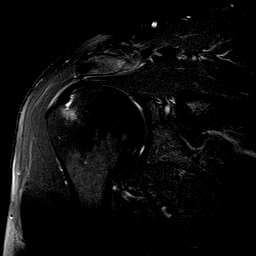
[im 10/15]
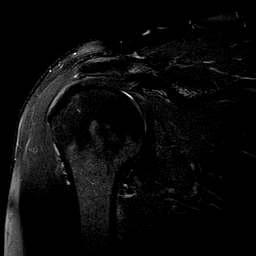
[im 12/15]
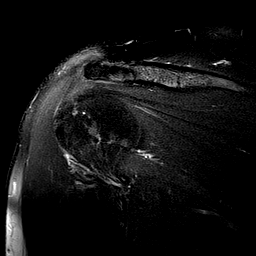
[im 15/15]
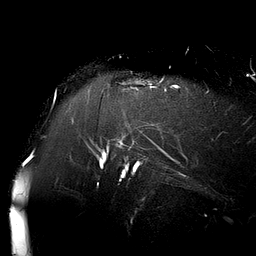

[Series 6: PD fat-sat · oblique · 4.0mm · 0.27mm/px · 7 of 15 slices shown (2 of 2)]
[im 1/15]
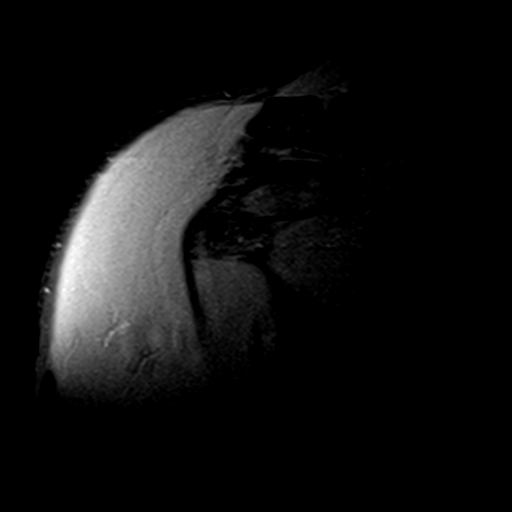
[im 3/15]
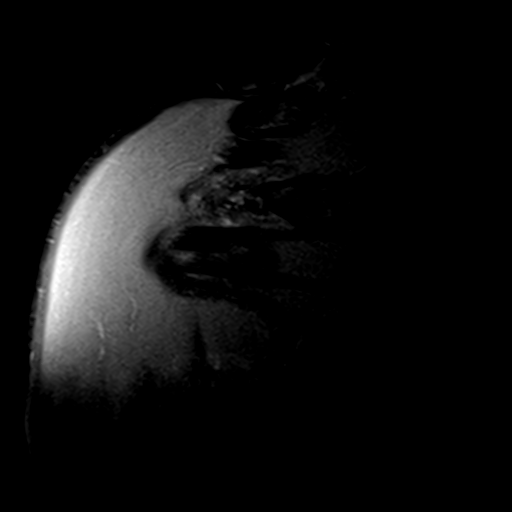
[im 5/15]
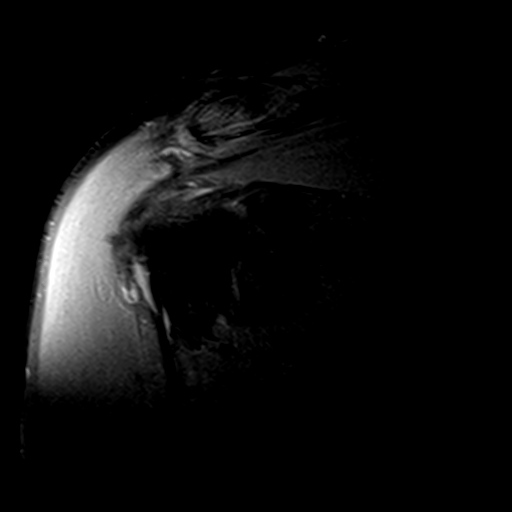
[im 8/15]
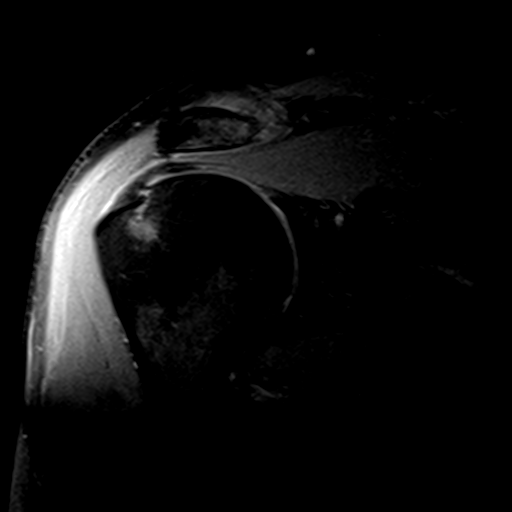
[im 10/15]
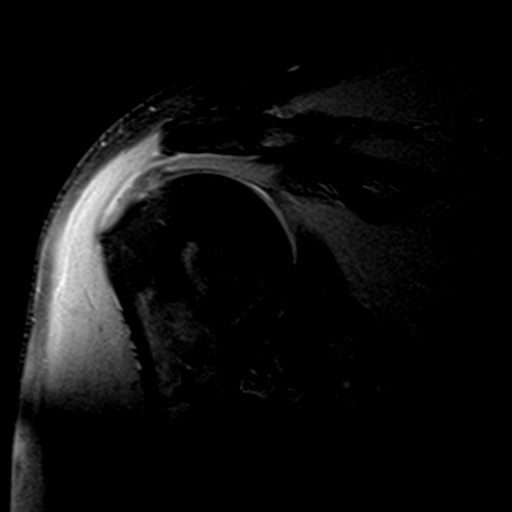
[im 12/15]
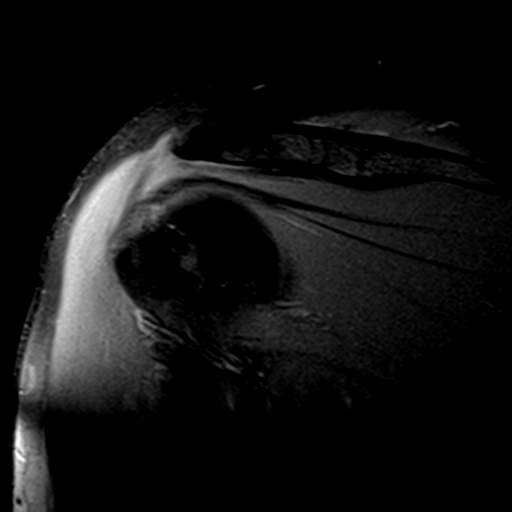
[im 15/15]
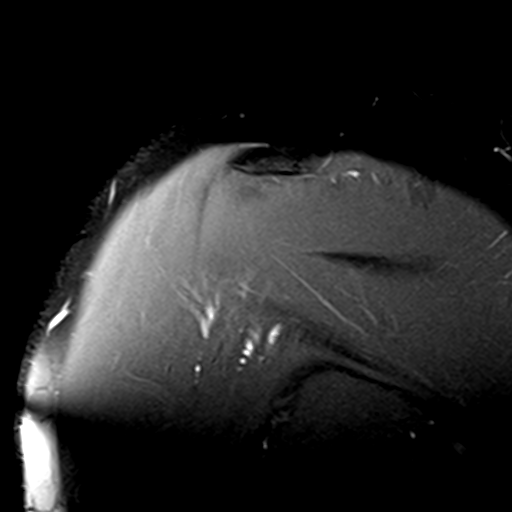

[Series 7: T2 fat-sat · oblique · 4.0mm · 0.55mm/px · 7 of 17 slices shown (2 of 2)]
[im 1/17]
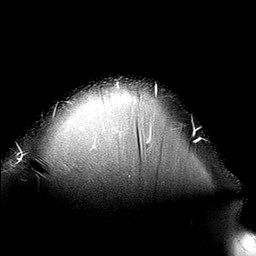
[im 3/17]
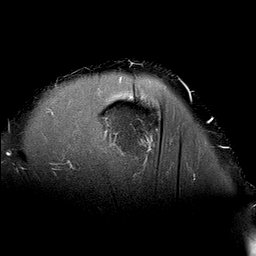
[im 5/17]
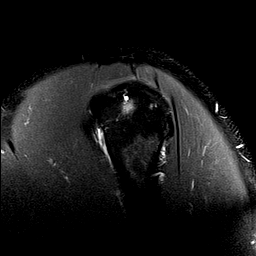
[im 7/17]
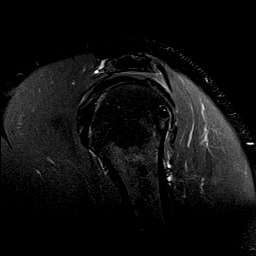
[im 10/17]
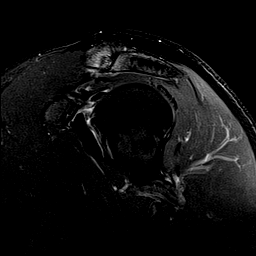
[im 12/17]
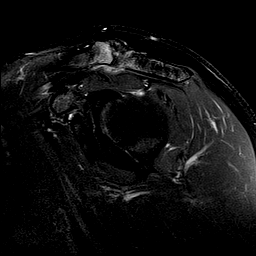
[im 14/17]
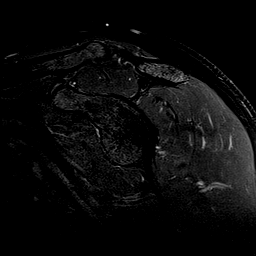

[29 of 40 positions shown; findings below may reference images not displayed]

FINDINGS: Rotator cuff: Mild rotator cuff tendinopathy/tendinosis but no
partial or full thickness tear.

Muscles:  Normal

Biceps long head:  Intact

Acromioclavicular Joint: Moderate AC joint arthropathy with edema
like signal abnormality in both the distal clavicle and acromion.
Mild subchondral cystic changes. Mild pannus formation. Type 2
acromion. No significant lateral downsloping or undersurface
spurring.

Glenohumeral Joint: Minimal degenerative changes. No joint effusion
or synovitis.

Labrum:  No labral tears are identified.

Bones:  No acute bony findings.

Other: Mild to moderate subacromial/subdeltoid bursitis.
IMPRESSION: 1. AC joint arthropathy [DATE] account for the patient's shoulder
pain.
2. Intact rotator cuff tendons.  Minimal tendinopathy.
3. Intact long head biceps tendon and glenoid labrum.
4. Mild to moderate subacromial/subdeltoid bursitis.

## 2020-03-31 ENCOUNTER — Other Ambulatory Visit: Payer: Self-pay | Admitting: Family Medicine

## 2020-03-31 ENCOUNTER — Ambulatory Visit
Admission: RE | Admit: 2020-03-31 | Discharge: 2020-03-31 | Disposition: A | Discharge status: Home / Self Care | Payer: 59 | Source: Ambulatory Visit | Attending: Family Medicine | Admitting: Family Medicine

## 2020-03-31 DIAGNOSIS — R059 Cough, unspecified: Secondary | ICD-10-CM

## 2021-05-06 ENCOUNTER — Other Ambulatory Visit: Payer: Self-pay

## 2021-05-06 ENCOUNTER — Emergency Department (HOSPITAL_BASED_OUTPATIENT_CLINIC_OR_DEPARTMENT_OTHER)
Admission: EM | Admit: 2021-05-06 | Discharge: 2021-05-07 | Disposition: A | Discharge status: Home / Self Care | Payer: 59 | Attending: Emergency Medicine | Admitting: Emergency Medicine

## 2021-05-06 ENCOUNTER — Emergency Department (HOSPITAL_BASED_OUTPATIENT_CLINIC_OR_DEPARTMENT_OTHER): Payer: 59 | Admitting: Radiology

## 2021-05-06 ENCOUNTER — Encounter (HOSPITAL_BASED_OUTPATIENT_CLINIC_OR_DEPARTMENT_OTHER): Payer: Self-pay | Admitting: Emergency Medicine

## 2021-05-06 DIAGNOSIS — J40 Bronchitis, not specified as acute or chronic: Secondary | ICD-10-CM | POA: Insufficient documentation

## 2021-05-06 DIAGNOSIS — K449 Diaphragmatic hernia without obstruction or gangrene: Secondary | ICD-10-CM | POA: Diagnosis not present

## 2021-05-06 DIAGNOSIS — E876 Hypokalemia: Secondary | ICD-10-CM | POA: Insufficient documentation

## 2021-05-06 DIAGNOSIS — R0602 Shortness of breath: Secondary | ICD-10-CM | POA: Diagnosis present

## 2021-05-06 LAB — CBC
HCT: 44.4 % (ref 39.0–52.0)
Hemoglobin: 15.2 g/dL (ref 13.0–17.0)
MCH: 28.9 pg (ref 26.0–34.0)
MCHC: 34.2 g/dL (ref 30.0–36.0)
MCV: 84.4 fL (ref 80.0–100.0)
Platelets: 281 10*3/uL (ref 150–400)
RBC: 5.26 MIL/uL (ref 4.22–5.81)
RDW: 12.6 % (ref 11.5–15.5)
WBC: 6.8 10*3/uL (ref 4.0–10.5)
nRBC: 0 % (ref 0.0–0.2)

## 2021-05-06 LAB — BASIC METABOLIC PANEL
Anion gap: 10 (ref 5–15)
BUN: 22 mg/dL — ABNORMAL HIGH (ref 6–20)
CO2: 29 mmol/L (ref 22–32)
Calcium: 10 mg/dL (ref 8.9–10.3)
Chloride: 103 mmol/L (ref 98–111)
Creatinine, Ser: 1.18 mg/dL (ref 0.61–1.24)
GFR, Estimated: >60 mL/min (ref >60)
Glucose, Bld: 141 mg/dL — ABNORMAL HIGH (ref 70–99)
Potassium: 3.3 mmol/L — ABNORMAL LOW (ref 3.5–5.1)
Sodium: 142 mmol/L (ref 135–145)

## 2021-05-06 LAB — TROPONIN I (HIGH SENSITIVITY): Troponin I (High Sensitivity): 2 ng/L (ref <18)

## 2021-05-06 MED ORDER — ALUM & MAG HYDROXIDE-SIMETH 200-200-20 MG/5ML PO SUSP
30.0000 mL | Freq: Once | ORAL | Status: AC
  Administered 2021-05-06: 30 mL via ORAL
  Filled 2021-05-06: qty 30

## 2021-05-06 MED ORDER — AEROCHAMBER PLUS FLO-VU MEDIUM MISC
1.0000 | Freq: Once | Status: AC
  Administered 2021-05-07: 1
  Filled 2021-05-06: qty 1

## 2021-05-06 MED ORDER — IPRATROPIUM-ALBUTEROL 0.5-2.5 (3) MG/3ML IN SOLN
RESPIRATORY_TRACT | Status: AC
  Administered 2021-05-06: 3 mL via RESPIRATORY_TRACT
  Filled 2021-05-06: qty 3

## 2021-05-06 MED ORDER — IPRATROPIUM-ALBUTEROL 0.5-2.5 (3) MG/3ML IN SOLN
3.0000 mL | Freq: Once | RESPIRATORY_TRACT | Status: AC
  Administered 2021-05-06: 3 mL via RESPIRATORY_TRACT
  Filled 2021-05-06: qty 3

## 2021-05-06 MED ORDER — POTASSIUM CHLORIDE CRYS ER 20 MEQ PO TBCR
40.0000 meq | EXTENDED_RELEASE_TABLET | Freq: Once | ORAL | Status: AC
  Administered 2021-05-06: 40 meq via ORAL
  Filled 2021-05-06: qty 2

## 2021-05-06 MED ORDER — IPRATROPIUM-ALBUTEROL 0.5-2.5 (3) MG/3ML IN SOLN
3.0000 mL | Freq: Once | RESPIRATORY_TRACT | Status: AC
  Administered 2021-05-06: 3 mL via RESPIRATORY_TRACT

## 2021-05-06 MED ORDER — ALBUTEROL SULFATE (2.5 MG/3ML) 0.083% IN NEBU
2.5000 mg | INHALATION_SOLUTION | Freq: Once | RESPIRATORY_TRACT | Status: AC
  Administered 2021-05-06: 2.5 mg via RESPIRATORY_TRACT
  Filled 2021-05-06: qty 3

## 2021-05-06 MED ORDER — ALBUTEROL SULFATE HFA 108 (90 BASE) MCG/ACT IN AERS
2.0000 | INHALATION_SPRAY | Freq: Once | RESPIRATORY_TRACT | Status: AC
  Administered 2021-05-07: 2 via RESPIRATORY_TRACT
  Filled 2021-05-06: qty 6.7

## 2021-05-06 NOTE — ED Triage Notes (Signed)
°  Patient comes in with chest pain and SOB.  [facility] states [facility] and [facility] with [facility] infection and given steroids and Augmentin.  Patient states he woke up this morning with mid chest pressure and SOB.  Patient states it feels like someone is sitting on his chest.  Pulse oximetry at home read 88%.  No radiating pain.  No nausea/vomiting.  Pain 7/10, pressure on chest.  Productive cough.

## 2021-05-07 ENCOUNTER — Encounter (HOSPITAL_BASED_OUTPATIENT_CLINIC_OR_DEPARTMENT_OTHER): Payer: Self-pay | Admitting: Emergency Medicine

## 2021-05-07 ENCOUNTER — Emergency Department (HOSPITAL_BASED_OUTPATIENT_CLINIC_OR_DEPARTMENT_OTHER): Payer: 59

## 2021-05-07 LAB — TROPONIN I (HIGH SENSITIVITY): Troponin I (High Sensitivity): <2 ng/L (ref <18)

## 2021-05-07 MED ORDER — OMEPRAZOLE 20 MG PO CPDR: 20.0000 mg | DELAYED_RELEASE_CAPSULE | Freq: Every day | ORAL | 0 refills | Status: AC

## 2021-05-07 MED ORDER — METHYLPREDNISOLONE SODIUM SUCC 125 MG IJ SOLR
125.0000 mg | INTRAMUSCULAR | Status: AC
  Administered 2021-05-07: 125 mg via INTRAVENOUS
  Filled 2021-05-07: qty 2

## 2021-05-07 MED ORDER — ALBUTEROL SULFATE HFA 108 (90 BASE) MCG/ACT IN AERS: 1.0000 | INHALATION_SPRAY | Freq: Four times a day (QID) | RESPIRATORY_TRACT | 0 refills | Status: AC | PRN

## 2021-05-07 MED ORDER — PREDNISONE 20 MG PO TABS: ORAL_TABLET | ORAL | 0 refills | Status: DC

## 2021-05-07 MED ORDER — IOHEXOL 350 MG/ML SOLN
100.0000 mL | Freq: Once | INTRAVENOUS | Status: AC | PRN
  Administered 2021-05-07: 75 mL via INTRAVENOUS

## 2021-05-07 MED ORDER — IOHEXOL 350 MG/ML SOLN: 80.0000 mL | Freq: Once | INTRAVENOUS | Status: DC | PRN

## 2021-05-07 NOTE — ED Notes (Addendum)
RT educated pt on proper use of MDI w/spacer. Pt able to perform w/out difficulty. Pt verbalizes understanding of administration and technique. Pt respiratory status stable at this time.Pt given MDI and spacer for home.

## 2021-05-07 NOTE — ED Notes (Signed)
Pulse Ox while ambulating reading 94% RA.

## 2021-05-07 NOTE — ED Provider Notes (Signed)
[facility] EMERGENC[facility] DEPT Provider Note   CSN: [id number] Arrival date & time: 05/06/21  2155     History  Chief Complaint  Patient presents with   Chest Pain   Shortness of Breath    Joshua Collins is a 51 y.o. male.  The history is provided by the patient.  Chest Pain Pain location:  Substernal area Pain quality: tightness   Pain radiates to:  Does not radiate Pain severity:  Moderate Onset quality:  Gradual Duration:  2 days Timing:  Constant Progression:  Unchanged Chronicity:  New Context: at rest   Context comment:  Wheezing and sinus congestion and started on antibiotics at [facility] on previous day. Relieved by:  Nothing Worsened by:  Nothing Ineffective treatments:  None tried Associated symptoms: shortness of breath   Associated symptoms: no abdominal pain, no altered mental status, no back pain, no diaphoresis, no fever, no headache, no heartburn, no lower extremity edema, no near-syncope and no palpitations   Associated symptoms comment:  Wheezing  Risk factors: male sex   Risk factors: no smoking   Shortness of Breath Severity:  Moderate Onset quality:  Gradual Duration:  2 days Timing:  Constant Progression:  Unchanged Chronicity:  New Context: URI   Relieved by:  Nothing Worsened by:  Nothing Associated symptoms: chest pain and wheezing   Associated symptoms: no abdominal pain, no diaphoresis, no fever and no headaches   Risk factors: no prolonged immobilization and no recent surgery       Home Medications Prior to Admission medications   Medication Sig Start Date End Date Taking? Authorizing Provider  albuterol (VENTOLIN HFA) 108 (90 Base) MCG/ACT inhaler Inhale 1-2 puffs into the lungs every 6 (six) hours as needed for wheezing or shortness of breath. 05/07/21  Yes Indigo Chaddock, MD  omeprazole (PRILOSEC) 20 MG capsule Take 1 capsule (20 mg total) by mouth daily. 05/07/21  Yes Rahman Ferrall, MD  predniSONE (DELTASONE) 20 MG tablet  3 tabs po day one, then 2 po daily x 4 days 05/07/21  Yes Doneisha Ivey, MD  amLODipine (NORVASC) 10 MG tablet Take 10 mg by mouth at bedtime.  04/19/19   [provider]  BYSTOLIC 10 MG tablet Take 10 mg by mouth at bedtime.  12/25/13   [provider]  cetirizine (ZYRTEC) 10 MG tablet Take 10 mg by mouth at bedtime.     [provider]  losartan-hydrochlorothiazide (HYZAAR) 100-25 MG tablet Take 1 tablet by mouth at bedtime.     [provider]  montelukast (SINGULAIR) 10 MG tablet Take 10 mg by mouth at bedtime.    [provider]  omeprazole (PRILOSEC) 20 MG capsule Take 20 mg by mouth as needed.    [provider]  oxyCODONE (OXY IR/ROXICODONE) 5 MG immediate release tablet Take 1-2 tablets (5-10 mg total) by mouth every 6 (six) hours as needed for moderate pain, severe pain or breakthrough pain. 09/19/19   Michael Boston, MD      Allergies    Patient has no known allergies.    Review of Systems   Review of Systems  Constitutional:  Negative for diaphoresis and fever.  HENT:  Positive for congestion.   Eyes:  Negative for redness.  Respiratory:  Positive for shortness of breath and wheezing.   Cardiovascular:  Positive for chest pain. Negative for palpitations, leg swelling and near-syncope.  Gastrointestinal:  Negative for abdominal pain and heartburn.  Genitourinary:  Negative for dysuria.  Musculoskeletal:  Negative for  back pain.  Neurological:  Negative for headaches.  Psychiatric/Behavioral:  Negative for agitation.   All other systems reviewed and are negative.  Physical Exam Updated Vital Signs BP 126/72 (BP Location: Right Arm)    Pulse 76    Temp 98 F (36.7 C) (Oral)    Resp 16    Ht 5\' 9"  (1.753 m)    Wt 88.5 kg    SpO2 96%    BMI 28.80 kg/m  Physical Exam Vitals and nursing note reviewed. Exam conducted with a chaperone present.  Constitutional:      General: He is not in acute distress.    Appearance: Normal  appearance. He is not ill-appearing.  HENT:     Head: Normocephalic and atraumatic.     Nose: Nose normal.  Eyes:     Conjunctiva/sclera: Conjunctivae normal.     Pupils: Pupils are equal, round, and reactive to light.  Cardiovascular:     Rate and Rhythm: Normal rate and regular rhythm.     Pulses: Normal pulses.     Heart sounds: Normal heart sounds.  Pulmonary:     Breath sounds: No stridor. Wheezing present. No rhonchi.  Abdominal:     General: Abdomen is flat. Bowel sounds are normal.     Palpations: Abdomen is soft.     Tenderness: There is no abdominal tenderness. There is no guarding.  Musculoskeletal:        General: Normal range of motion.     Cervical back: Normal range of motion and neck supple.  Skin:    General: Skin is warm and dry.     Capillary Refill: Capillary refill takes less than 2 seconds.  Neurological:     General: No focal deficit present.     Mental Status: He is alert and oriented to person, place, and time.     Deep Tendon Reflexes: Reflexes normal.  Psychiatric:        Mood and Affect: Mood normal.        Behavior: Behavior normal.    ED Results / Procedures / Treatments   Labs (all labs ordered are listed, but only abnormal results are displayed) Labs Reviewed  BASIC METABOLIC PANEL - Abnormal; Notable for the following components:      Result Value   Potassium 3.3 (*)    Glucose, Bld 141 (*)    BUN 22 (*)    All other components within normal limits  CBC  TROPONIN I (HIGH SENSITIVITY)  TROPONIN I (HIGH SENSITIVITY)    EKG EKG Interpretation  Date/Time:  Thursday May 06 2021 22:03:15 EST Ventricular Rate:  75 PR Interval:  186 QRS Duration: 92 QT Interval:  382 QTC Calculation: 426 R Axis:   49 Text Interpretation: Normal sinus rhythm Confirmed by Randal Buba, Kemet Nijjar ([id number]) on 05/06/2021 11:09:56 PM  Radiology DG Chest 2 View  Result Date: 05/06/2021 CLINICAL DATA:  Chest pain and shortness of breath. EXAM: CHEST - 2 VIEW  COMPARISON:  PA Lat 03/31/2020 FINDINGS: The heart size and mediastinal contours are within normal limits. Both lungs are clear. The visualized skeletal structures are unremarkable. IMPRESSION: No active cardiopulmonary disease.  Stable chest. Electronically Signed   By: Telford Nab M.D.   On: 05/06/2021 22:34   CT Angio Chest PE W and/or Wo Contrast  Result Date: 05/07/2021 CLINICAL DATA:  Chest pain or SOB, pleurisy or effusion suspected. Mid chest pain. EXAM: CT ANGIOGRAPHY CHEST WITH CONTRAST TECHNIQUE: Multidetector CT imaging of the chest was performed using the  standard protocol during bolus administration of intravenous contrast. Multiplanar CT image reconstructions and MIPs were obtained to evaluate the vascular anatomy. RADIATION DOSE REDUCTION: This exam was performed according to the departmental dose-optimization program which includes automated exposure control, adjustment of the mA and/or kV according to patient size and/or use of iterative reconstruction technique. CONTRAST:  63mL OMNIPAQUE IOHEXOL 350 MG/ML SOLN COMPARISON:  None. FINDINGS: Cardiovascular: Adequate opacification of the pulmonary arterial tree. No intraluminal filling defect identified to suggest acute pulmonary embolism. Central pulmonary arteries are of normal caliber. No significant coronary artery calcification. Cardiac size within normal limits. No pericardial effusion. No significant atherosclerotic calcification within the thoracic aorta. No aortic aneurysm. Mediastinum/Nodes: No enlarged mediastinal, hilar, or axillary lymph nodes. Thyroid gland, trachea, and esophagus demonstrate no significant findings. Small hiatal hernia. Lungs/Pleura: Lungs are clear. No pleural effusion or pneumothorax. Upper Abdomen: No acute abnormality. Musculoskeletal: No chest wall abnormality. No acute or significant osseous findings. Review of the MIP images confirms the above findings. IMPRESSION: No acute pulmonary embolism. No acute  intrathoracic pathology identified. Small hiatal hernia. Electronically Signed   By: Fidela Salisbury M.D.   On: 05/07/2021 02:22    Procedures Procedures    Medications Ordered in ED Medications  iohexol (OMNIPAQUE) 350 MG/ML injection 80 mL (has no administration in time range)  ipratropium-albuterol (DUONEB) 0.5-2.5 (3) MG/3ML nebulizer solution 3 mL (3 mLs Nebulization Given 05/06/21 2218)  alum & mag hydroxide-simeth (MAALOX/MYLANTA) 200-200-20 MG/5ML suspension 30 mL (30 mLs Oral Given 05/06/21 2354)  potassium chloride SA (KLOR-CON M) CR tablet 40 mEq (40 mEq Oral Given 05/06/21 2354)  ipratropium-albuterol (DUONEB) 0.5-2.5 (3) MG/3ML nebulizer solution 3 mL (3 mLs Nebulization Given 05/06/21 2341)  albuterol (PROVENTIL) (2.5 MG/3ML) 0.083% nebulizer solution 2.5 mg (2.5 mg Nebulization Given 05/06/21 2341)  albuterol (VENTOLIN HFA) 108 (90 Base) MCG/ACT inhaler 2 puff (2 puffs Inhalation Given 05/07/21 0009)  AeroChamber Plus Flo-Vu Medium MISC 1 each (1 each Other Given 05/07/21 0008)  methylPREDNISolone sodium succinate (SOLU-MEDROL) 125 mg/2 mL injection 125 mg (125 mg Intravenous Given 05/07/21 0154)  iohexol (OMNIPAQUE) 350 MG/ML injection 100 mL (75 mLs Intravenous Contrast Given 05/07/21 0206)    ED Course/ Medical Decision Making/ A&P                           Medical Decision Making Wheezing and nasal congestion, wheezing and chest tightness, [facility] and diagnosed with bronchitis and sinus infection and started on steroids and antibiotics.  No relief of symptoms   Amount and/or Complexity of Data Reviewed Independent Historian: spouse    Details: see above Labs: ordered.    Details: 2 negative troponins, normal electrolytes and CBC Radiology: ordered.    Details: negative CXR CTA with hiatal hernia ECG/medicine tests: ordered and independent interpretation performed. Decision-making details documented in ED Course.  Risk OTC drugs. Prescription drug management. Decision  regarding hospitalization. Risk Details: Given hypoxia on arrival I considered hospitalization.  Patient O2 saturation improved post nebs and steroids in the ED. Normal with ambulation.  Ruled out for MI with normal EKG and 2 negative troponins. No ACS today.  HEART score 2 low risk for MACE.  Symptoms are not consistent with cardiac wheeze.  No PE on CTA.  I suspect this is viral bronchitis that will not respond to antibiotics.  Inhaler given with spacer and RX for inhaler and steroids.  Also PPI as patient has a hiatal hernia.  Stable for discharge  with close follow up.  Strict return precautions given     Final Clinical Impression(s) / ED Diagnoses Final diagnoses:  Hypokalemia  Bronchitis  Hiatal hernia   Return for intractable cough, coughing up blood, fevers > 100.4 unrelieved by medication, shortness of breath, intractable vomiting, chest pain, shortness of breath, weakness, numbness, changes in speech, facial asymmetry, abdominal pain, passing out, Inability to tolerate liquids or food, cough, altered mental status or any concerns. No signs of systemic illness or infection. The patient is nontoxic-appearing on exam and vital signs are within normal limits.  I have reviewed the triage vital signs and the nursing notes. Pertinent labs & imaging results that were available during my care of the patient were reviewed by me and considered in my medical decision making (see chart for details). After history, exam, and medical workup I feel the patient has been appropriately medically screened and is safe for discharge home. Pertinent diagnoses were discussed with the patient. Patient was given return precautions. Rx / DC Orders ED Discharge Orders          Ordered    predniSONE (DELTASONE) 20 MG tablet        05/07/21 0256    omeprazole (PRILOSEC) 20 MG capsule  Daily        05/07/21 0256    albuterol (VENTOLIN HFA) 108 (90 Base) MCG/ACT inhaler  Every 6 hours PRN        05/07/21 0256               Grabiela Wohlford, MD 05/07/21 0401

## 2021-06-15 ENCOUNTER — Emergency Department (HOSPITAL_BASED_OUTPATIENT_CLINIC_OR_DEPARTMENT_OTHER)
Admission: EM | Admit: 2021-06-15 | Discharge: 2021-06-15 | Disposition: A | Discharge status: Home / Self Care | Payer: 59 | Attending: Emergency Medicine | Admitting: Emergency Medicine

## 2021-06-15 ENCOUNTER — Other Ambulatory Visit: Payer: Self-pay

## 2021-06-15 ENCOUNTER — Encounter (HOSPITAL_BASED_OUTPATIENT_CLINIC_OR_DEPARTMENT_OTHER): Payer: Self-pay | Admitting: Emergency Medicine

## 2021-06-15 DIAGNOSIS — J310 Chronic rhinitis: Secondary | ICD-10-CM | POA: Diagnosis not present

## 2021-06-15 DIAGNOSIS — Z79899 Other long term (current) drug therapy: Secondary | ICD-10-CM | POA: Insufficient documentation

## 2021-06-15 DIAGNOSIS — R0981 Nasal congestion: Secondary | ICD-10-CM | POA: Diagnosis present

## 2021-06-15 DIAGNOSIS — T485X5A Adverse effect of other anti-common-cold drugs, initial encounter: Secondary | ICD-10-CM

## 2021-06-15 NOTE — ED Provider Notes (Signed)
? ?[facility] EMER[facility] DEPT  ?Provider Note ? ?CSN: [id number] ?Arrival date & time: 06/15/21 0057 ? ?History ?Chief Complaint  ?Patient presents with  ? Nasal Congestion  ? ? ?Joshua Collins is a 51 y.o. male reports several days of nasal congestion and post-nasal drip. He went to [facility] and was diagnosed with allergies, given Flonase and Z-pack which have not helped. He was using OTC Afrin for several days and now feels worse. No fever, no SOB to cough. He has also tried some OTC oral decongestants but has HTN so did not use that much.  ? ? ?Home Medications ?Prior to Admission medications   ?Medication Sig Start Date End Date Taking? Authorizing Provider  ?albuterol (VENTOLIN HFA) 108 (90 Base) MCG/ACT inhaler Inhale 1-2 puffs into the lungs every 6 (six) hours as needed for wheezing or shortness of breath. 05/07/21   Palumbo, April, MD  ?amLODipine (NORVASC) 10 MG tablet Take 10 mg by mouth at bedtime.  04/19/19   [provider]  ?BYSTOLIC 10 MG tablet Take 10 mg by mouth at bedtime.  12/25/13   [provider]  ?cetirizine (ZYRTEC) 10 MG tablet Take 10 mg by mouth at bedtime.     [provider]  ?losartan-hydrochlorothiazide (HYZAAR) 100-25 MG tablet Take 1 tablet by mouth at bedtime.     [provider]  ?montelukast (SINGULAIR) 10 MG tablet Take 10 mg by mouth at bedtime.    [provider]  ?omeprazole (PRILOSEC) 20 MG capsule Take 20 mg by mouth as needed.    [provider]  ?omeprazole (PRILOSEC) 20 MG capsule Take 1 capsule (20 mg total) by mouth daily. 05/07/21   Palumbo, April, MD  ?oxyCODONE (OXY IR/ROXICODONE) 5 MG immediate release tablet Take 1-2 tablets (5-10 mg total) by mouth every 6 (six) hours as needed for moderate pain, severe pain or breakthrough pain. 09/19/19   Michael Boston, MD  ?predniSONE (DELTASONE) 20 MG tablet 3 tabs po day one, then 2 po daily x 4 days 05/07/21   Randal Buba, April, MD  ? ? ? ?Allergies    ?Patient has no  known allergies. ? ? ?Review of Systems   ?Review of Systems ?Please see HPI for pertinent positives and negatives ? ?Physical Exam ?BP (!) 141/107 (BP Location: Right Arm)   Pulse 65   Temp 98.2 ?F (36.8 ?C) (Oral)   Resp 20   Wt 88.5 kg   SpO2 100%   BMI 28.80 kg/m?  ? ?Physical Exam ?Vitals and nursing note reviewed.  ?Constitutional:   ?   Appearance: Normal appearance.  ?HENT:  ?   Head: Normocephalic and atraumatic.  ?   Nose: Congestion and rhinorrhea present.  ?   Mouth/Throat:  ?   Mouth: Mucous membranes are moist.  ?Eyes:  ?   Extraocular Movements: Extraocular movements intact.  ?   Conjunctiva/sclera: Conjunctivae normal.  ?Cardiovascular:  ?   Rate and Rhythm: Normal rate.  ?Pulmonary:  ?   Effort: Pulmonary effort is normal.  ?   Breath sounds: Normal breath sounds.  ?Abdominal:  ?   General: Abdomen is flat.  ?   Palpations: Abdomen is soft.  ?   Tenderness: There is no abdominal tenderness.  ?Musculoskeletal:     ?   General: No swelling. Normal range of motion.  ?   Cervical back: Neck supple.  ?Skin: ?   General: Skin is warm and dry.  ?Neurological:  ?   General: No focal deficit present.  ?  Mental Status: He is alert.  ?Psychiatric:     ?   Mood and Affect: Mood normal.  ? ? ?ED Results / Procedures / Treatments   ?EKG ?None ? ?Procedures ?Procedures ? ?Medications Ordered in the ED ?Medications - No data to display ? ?Initial Impression and Plan ? Patient with nasal congestion, likely worsened due to Afrin use. Recommend he discontinue Afrin, try oral antihistamines, continue Flonase. He will be referred to ENT for further evaluation if not improved in several days. Nasal passages are too edematous today to fully visualize. He was also recently treated with steroids for bronchitis, so will avoid additional steroids now without a compelling reason to do so.  ? ?ED Course  ? ?  ? ? ?MDM Rules/Calculators/A&P ?Medical Decision Making ?Problems Addressed: ?Rhinitis medicamentosa: acute  illness or injury ? ?Risk ?OTC drugs. ? ? ? ?Final Clinical Impression(s) / ED Diagnoses ?Final diagnoses:  ?Rhinitis medicamentosa  ? ? ?Rx / DC Orders ?ED Discharge Orders   ? ? None  ? ?  ? ?  ?Truddie Hidden, MD ?06/15/21 [phone] ? ?

## 2021-06-15 NOTE — ED Triage Notes (Signed)
[facility], given z-pack and told was allergies. Congestion has worsened to the point that he is unable to take afrin (has been taking for 4 days). Positive sore throat from postnasal drip. Has tried sudafed decongestant ?Denies facial pain, fever, chills, trouble swallowing.   ?

## 2021-06-15 NOTE — ED Notes (Signed)
Patient verbalizes understanding of discharge instructions. Opportunity for questioning and answers were provided. Armband removed by staff, pt discharged from ED to home via POV. Alert, orient x3, ambulatory.  ?

## 2021-06-17 ENCOUNTER — Institutional Professional Consult (permissible substitution): Payer: 59 | Admitting: Neurology

## 2021-06-17 ENCOUNTER — Encounter: Payer: Self-pay | Admitting: Neurology

## 2021-06-17 IMAGING — CR DG ABDOMEN 1V
2 series · 2 of 2 positions shown · non-contrast
Comparison: 03/07/2019 from [HOSPITAL]

CLINICAL DATA: Nephrolithiasis.

EXAM:
ABDOMEN - 1 VIEW

[t abdomen supine (1 of 2)]
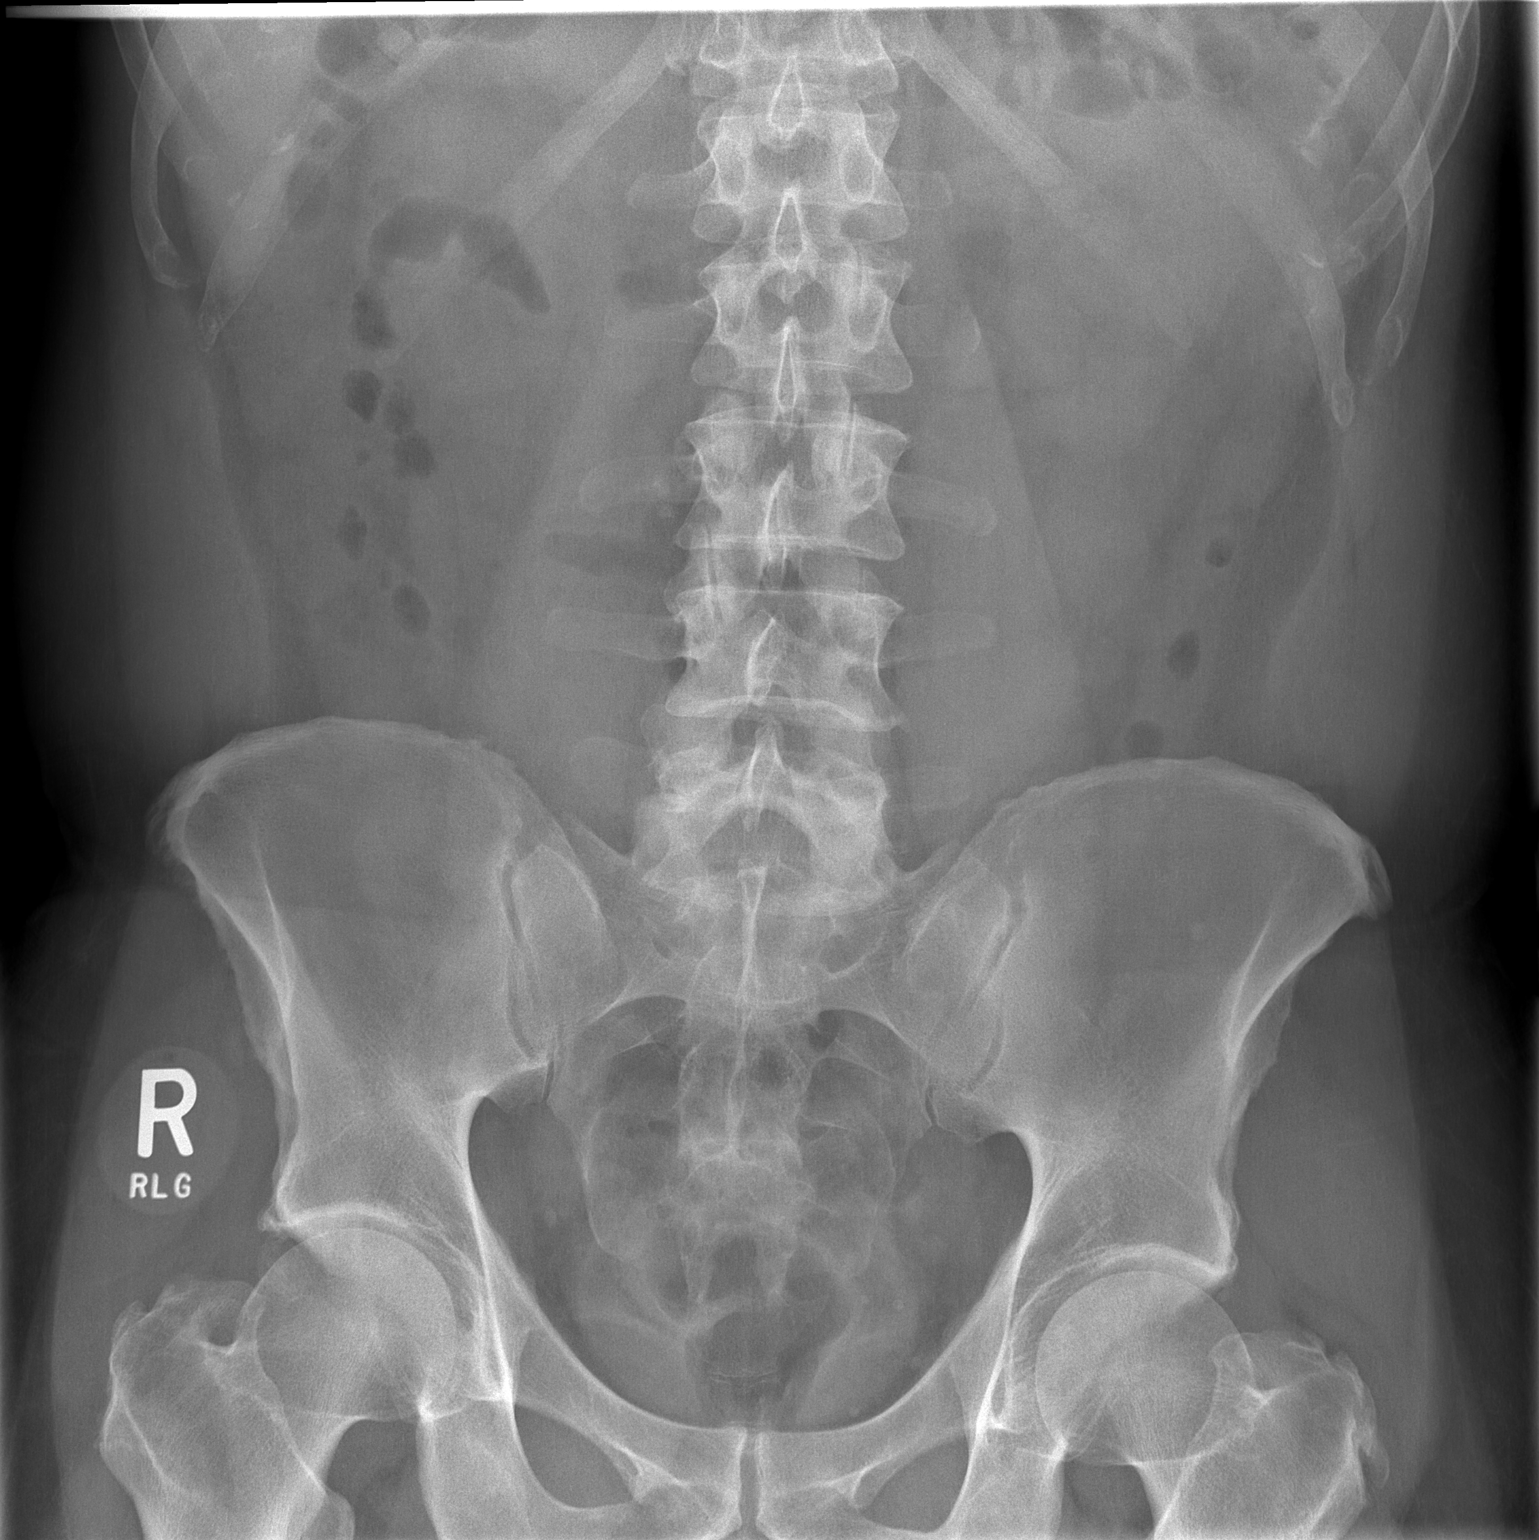

[t abdomen supine (2 of 2)]
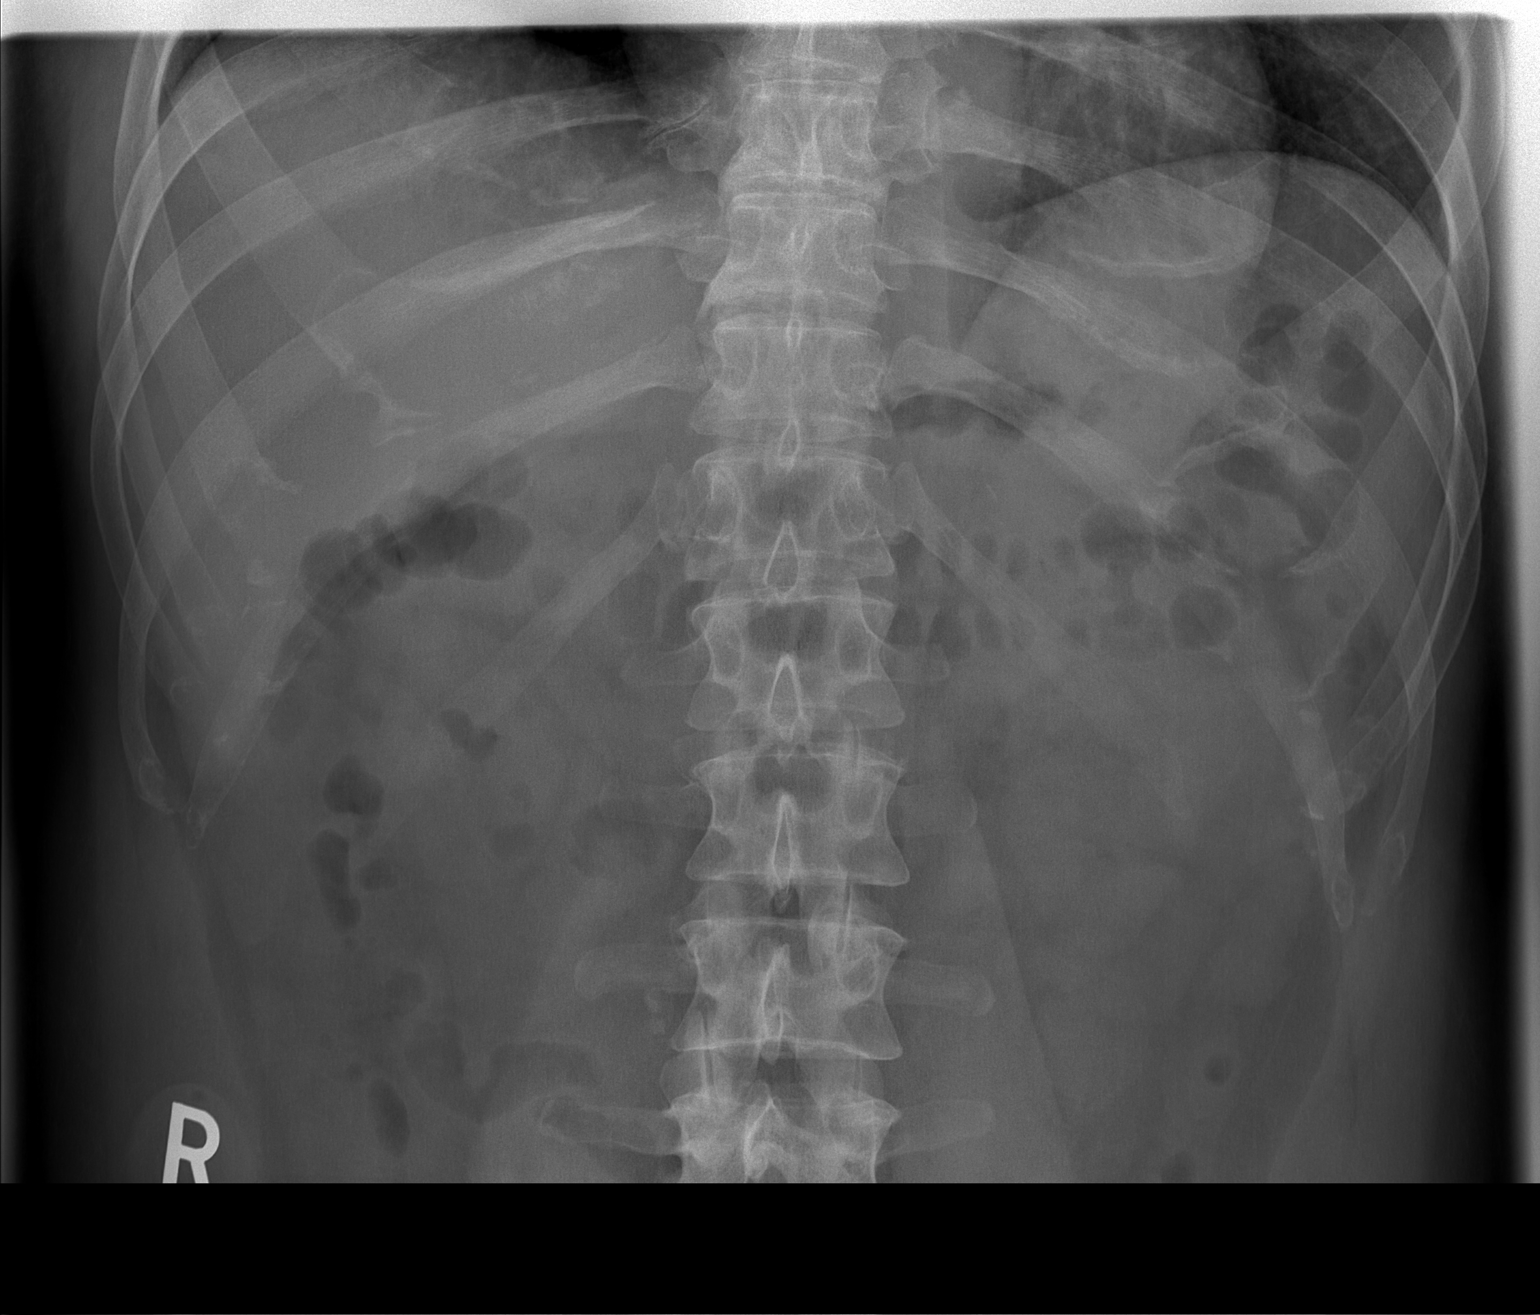

[2 of 2 positions shown; findings below may reference images not displayed]

FINDINGS: And 3 mm radiodensity is again seen overlying the course the
proximal right ureter, just inferior to the L3 transverse process.
This is unchanged in position since prior study. There is also a 4
mm radiodensity in the right pelvis, which could represent a distal
right ureteral calculus. This is also unchanged since prior exam.

Bowel gas pattern is normal.
IMPRESSION: 1. No significant change in position of 3 mm proximal right ureteral
calculus since prior study.
2. Stable 4 mm radiodensity in right pelvis, which could represent a
distal right ureteral calculus.

## 2022-03-09 ENCOUNTER — Ambulatory Visit
Admission: RE | Admit: 2022-03-09 | Discharge: 2022-03-09 | Disposition: A | Discharge status: Home / Self Care | Payer: 59 | Source: Ambulatory Visit | Attending: Chiropractic Medicine | Admitting: Chiropractic Medicine

## 2022-03-09 ENCOUNTER — Other Ambulatory Visit: Payer: Self-pay | Admitting: Chiropractic Medicine

## 2022-03-09 DIAGNOSIS — G8929 Other chronic pain: Secondary | ICD-10-CM

## 2022-07-02 ENCOUNTER — Other Ambulatory Visit: Payer: Self-pay

## 2022-07-02 ENCOUNTER — Emergency Department (HOSPITAL_BASED_OUTPATIENT_CLINIC_OR_DEPARTMENT_OTHER)
Admission: EM | Admit: 2022-07-02 | Discharge: 2022-07-03 | Disposition: A | Discharge status: Home / Self Care | Payer: 59 | Attending: Emergency Medicine | Admitting: Emergency Medicine

## 2022-07-02 ENCOUNTER — Emergency Department (HOSPITAL_BASED_OUTPATIENT_CLINIC_OR_DEPARTMENT_OTHER): Payer: 59

## 2022-07-02 DIAGNOSIS — N202 Calculus of kidney with calculus of ureter: Secondary | ICD-10-CM | POA: Insufficient documentation

## 2022-07-02 DIAGNOSIS — Z79899 Other long term (current) drug therapy: Secondary | ICD-10-CM | POA: Insufficient documentation

## 2022-07-02 DIAGNOSIS — R109 Unspecified abdominal pain: Secondary | ICD-10-CM | POA: Diagnosis present

## 2022-07-02 DIAGNOSIS — N200 Calculus of kidney: Secondary | ICD-10-CM

## 2022-07-02 DIAGNOSIS — I10 Essential (primary) hypertension: Secondary | ICD-10-CM | POA: Diagnosis not present

## 2022-07-02 LAB — URINALYSIS, ROUTINE W REFLEX MICROSCOPIC
Bacteria, UA: NONE SEEN
Bilirubin Urine: NEGATIVE
Glucose, UA: NEGATIVE mg/dL
Leukocytes,Ua: NEGATIVE
Nitrite: NEGATIVE
Protein, ur: 30 mg/dL — AB
RBC / HPF: >50 RBC/hpf (ref 0–5)
Specific Gravity, Urine: 1.035 — ABNORMAL HIGH (ref 1.005–1.030)
pH: 5.5 (ref 5.0–8.0)

## 2022-07-02 LAB — COMPREHENSIVE METABOLIC PANEL
ALT: 18 U/L (ref 0–44)
AST: 16 U/L (ref 15–41)
Albumin: 5 g/dL (ref 3.5–5.0)
Alkaline Phosphatase: 41 U/L (ref 38–126)
Anion gap: 10 (ref 5–15)
BUN: 20 mg/dL (ref 6–20)
CO2: 34 mmol/L — ABNORMAL HIGH (ref 22–32)
Calcium: 10.4 mg/dL — ABNORMAL HIGH (ref 8.9–10.3)
Chloride: 100 mmol/L (ref 98–111)
Creatinine, Ser: 1.24 mg/dL (ref 0.61–1.24)
GFR, Estimated: >60 mL/min (ref >60)
Glucose, Bld: 121 mg/dL — ABNORMAL HIGH (ref 70–99)
Potassium: 3.4 mmol/L — ABNORMAL LOW (ref 3.5–5.1)
Sodium: 144 mmol/L (ref 135–145)
Total Bilirubin: 0.7 mg/dL (ref 0.3–1.2)
Total Protein: 7.1 g/dL (ref 6.5–8.1)

## 2022-07-02 LAB — CBC
HCT: 44.7 % (ref 39.0–52.0)
Hemoglobin: 15.1 g/dL (ref 13.0–17.0)
MCH: 28.9 pg (ref 26.0–34.0)
MCHC: 33.8 g/dL (ref 30.0–36.0)
MCV: 85.6 fL (ref 80.0–100.0)
Platelets: 305 10*3/uL (ref 150–400)
RBC: 5.22 MIL/uL (ref 4.22–5.81)
RDW: 12.8 % (ref 11.5–15.5)
WBC: 7.9 10*3/uL (ref 4.0–10.5)
nRBC: 0 % (ref 0.0–0.2)

## 2022-07-02 LAB — LIPASE, BLOOD: Lipase: 49 U/L (ref 11–51)

## 2022-07-02 MED ORDER — KETOROLAC TROMETHAMINE 15 MG/ML IJ SOLN
15.0000 mg | Freq: Once | INTRAMUSCULAR | Status: AC
  Administered 2022-07-02: 15 mg via INTRAVENOUS
  Filled 2022-07-02: qty 1

## 2022-07-02 MED ORDER — MORPHINE SULFATE (PF) 4 MG/ML IV SOLN
4.0000 mg | Freq: Once | INTRAVENOUS | Status: AC
  Administered 2022-07-02: 4 mg via INTRAVENOUS
  Filled 2022-07-02: qty 1

## 2022-07-02 MED ORDER — ONDANSETRON HCL 4 MG/2ML IJ SOLN
4.0000 mg | Freq: Once | INTRAMUSCULAR | Status: AC
  Administered 2022-07-03: 4 mg via INTRAVENOUS
  Filled 2022-07-02: qty 2

## 2022-07-02 MED ORDER — ONDANSETRON HCL 4 MG/2ML IJ SOLN
4.0000 mg | Freq: Once | INTRAMUSCULAR | Status: AC | PRN
  Administered 2022-07-02: 4 mg via INTRAVENOUS
  Filled 2022-07-02: qty 2

## 2022-07-02 MED ORDER — MORPHINE SULFATE (PF) 4 MG/ML IV SOLN
4.0000 mg | Freq: Once | INTRAVENOUS | Status: AC
  Administered 2022-07-03: 4 mg via INTRAVENOUS
  Filled 2022-07-02: qty 1

## 2022-07-02 NOTE — Discharge Instructions (Signed)
You were seen in the emergency department and found to have a kidney stone.  Please take ibuprofen as needed for pain.  We are sending you home with multiple medications to assist with passing the stone and for residual pain/nausea:  -Flomax-this is a medication to help pass the stone, it allows urine to exit the body more freely.  Please take this once daily with a meal.  Begin taking Percocet as prescribed as needed for pain and Zofran as prescribed as needed for nausea.  -Percocet-this is a narcotic/controlled substance medication that has potential addicting qualities.  We recommend that you take 1-2 tablets every 6 hours as needed for severe pain.  Do not drive or operate heavy machinery when taking this medicine as it can be sedating. Do not drink alcohol or take other sedating medications when taking this medicine for safety reasons.  Keep this out of reach of small children.  Please be aware this medicine has Tylenol in it (325 mg/tab) do not exceed the maximum dose of Tylenol in a day per over the counter recommendations should you decide to supplement with Tylenol over the counter.   -Zofran-this is an antinausea medication, you may take this every 8 hours as needed for nausea and vomiting, please allow the tablet to dissolve underneath of your tongue.   We have prescribed you new medication(s) today. Discuss the medications prescribed today with your pharmacist as they can have adverse effects and interactions with your other medicines including over the counter and prescribed medications. Seek medical evaluation if you start to experience new or abnormal symptoms after taking one of these medicines, seek care immediately if you start to experience difficulty breathing, feeling of your throat closing, facial swelling, or rash as these could be indications of a more serious allergic reaction  Please follow-up with the urology group provided in your discharge instructions within 3 to 5 days.   Return to the ER for new or worsening symptoms including but not limited to worsening pain not controlled by these medicines, inability to keep fluids down, fever, or any other concerns that you may have.

## 2022-07-02 NOTE — ED Provider Notes (Cosign Needed)
[place] [facility] DEPARTMENT AT [facility] Provider Note   CSN: [id number] Arrival date & time: 07/02/22  2151     History  Chief Complaint  Patient presents with   Flank Pain   Nausea    Joshua Collins is a 52 y.o. male with past medical history significant for multiple kidney stones presents with acute onset left flank pain, nausea that started 45 minutes prior to arrival.  Patient reports multiple history of previous kidney stones.  He not take anything for pain prior to arrival.  He denies any recent dysuria, hematuria leading up to his new flank pain.   Flank Pain       Home Medications Prior to Admission medications   Medication Sig Start Date End Date Taking? Authorizing Provider  ondansetron (ZOFRAN-ODT) 8 MG disintegrating tablet 8mg  ODT q4 hours prn nausea 07/03/22  Yes Delo, Riley Lam, MD  oxyCODONE-acetaminophen (PERCOCET) 5-325 MG tablet Take 2 tablets by mouth every 4 (four) hours as needed. 07/03/22  Yes Delo, Riley Lam, MD  albuterol (VENTOLIN HFA) 108 (90 Base) MCG/ACT inhaler Inhale 1-2 puffs into the lungs every 6 (six) hours as needed for wheezing or shortness of breath. 05/07/21   Palumbo, April, MD  amLODipine (NORVASC) 10 MG tablet Take 10 mg by mouth at bedtime.  04/19/19   [provider]  BYSTOLIC 10 MG tablet Take 10 mg by mouth at bedtime.  12/25/13   [provider]  cetirizine (ZYRTEC) 10 MG tablet Take 10 mg by mouth at bedtime.     [provider]  losartan-hydrochlorothiazide (HYZAAR) 100-25 MG tablet Take 1 tablet by mouth at bedtime.     [provider]  montelukast (SINGULAIR) 10 MG tablet Take 10 mg by mouth at bedtime.    [provider]  omeprazole (PRILOSEC) 20 MG capsule Take 20 mg by mouth as needed.    [provider]  omeprazole (PRILOSEC) 20 MG capsule Take 1 capsule (20 mg total) by mouth daily. 05/07/21   Palumbo, April, MD  oxyCODONE (OXY IR/ROXICODONE) 5 MG immediate release  tablet Take 1-2 tablets (5-10 mg total) by mouth every 6 (six) hours as needed for moderate pain, severe pain or breakthrough pain. 09/19/19   Karie Soda, MD  predniSONE (DELTASONE) 20 MG tablet 3 tabs po day one, then 2 po daily x 4 days 05/07/21   Nicanor Alcon, April, MD      Allergies    Patient has no known allergies.    Review of Systems   Review of Systems  Genitourinary:  Positive for flank pain.  All other systems reviewed and are negative.   Physical Exam Updated Vital Signs BP (!) 138/97   Pulse 74   Temp 97.9 F (36.6 C) (Oral)   Resp 18   Ht 5\' 8"  (1.727 m)   Wt 81.6 kg   SpO2 100%   BMI 27.37 kg/m  Physical Exam Vitals and nursing note reviewed.  Constitutional:      General: He is not in acute distress.    Appearance: Normal appearance.     Comments: Appear to be in pain, anxious, cannot sit down  HENT:     Head: Normocephalic and atraumatic.  Eyes:     General:        Right eye: No discharge.        Left eye: No discharge.  Cardiovascular:     Rate and Rhythm: Normal rate and regular rhythm.     Heart sounds: No murmur heard.  No friction rub. No gallop.  Pulmonary:     Effort: Pulmonary effort is normal.     Breath sounds: Normal breath sounds.  Abdominal:     General: Bowel sounds are normal.     Palpations: Abdomen is soft.     Comments: Significant flank pain on the left, radiation around abdomen, positive CVA tenderness on the left  Skin:    General: Skin is warm and dry.     Capillary Refill: Capillary refill takes less than 2 seconds.  Neurological:     Mental Status: He is alert and oriented to person, place, and time.  Psychiatric:        Mood and Affect: Mood normal.        Behavior: Behavior normal.     ED Results / Procedures / Treatments   Labs (all labs ordered are listed, but only abnormal results are displayed) Labs Reviewed  COMPREHENSIVE METABOLIC PANEL - Abnormal; Notable for the following components:      Result Value    Potassium 3.4 (*)    CO2 34 (*)    Glucose, Bld 121 (*)    Calcium 10.4 (*)    All other components within normal limits  URINALYSIS, ROUTINE W REFLEX MICROSCOPIC - Abnormal; Notable for the following components:   Specific Gravity, Urine 1.035 (*)    Hgb urine dipstick LARGE (*)    Ketones, ur TRACE (*)    Protein, ur 30 (*)    All other components within normal limits  LIPASE, BLOOD  CBC    EKG None  Radiology CT Renal Stone Study  Result Date: 07/02/2022 CLINICAL DATA:  Acute abdominal pain EXAM: CT ABDOMEN AND PELVIS WITHOUT CONTRAST TECHNIQUE: Multidetector CT imaging of the abdomen and pelvis was performed following the standard protocol without IV contrast. RADIATION DOSE REDUCTION: This exam was performed according to the departmental dose-optimization program which includes automated exposure control, adjustment of the mA and/or kV according to patient size and/or use of iterative reconstruction technique. COMPARISON:  None Available. FINDINGS: Lower chest: No acute abnormality. Hepatobiliary: No focal liver abnormality is seen. No gallstones, gallbladder wall thickening, or biliary dilatation. Pancreas: Unremarkable. No pancreatic ductal dilatation or surrounding inflammatory changes. Spleen: Normal in size without focal abnormality. Adrenals/Urinary Tract: Adrenal glands are within normal limits. Kidneys are well visualized bilaterally. No renal calculi are noted. Mild fullness of the left collecting system and left ureter is seen secondary to a left mid ureteral stone measuring 6 mm. This is best visualized on image number 45 of series 5. The more distal left ureter is within normal limits. The bladder is decompressed. Stomach/Bowel: No obstructive or inflammatory changes of the colon are seen. The appendix is within normal limits. Small bowel and stomach are unremarkable with the exception of a small sliding-type hiatal hernia. Vascular/Lymphatic: No significant vascular findings are  present. No enlarged abdominal or pelvic lymph nodes. Reproductive: Prostate is unremarkable. Other: No abdominal wall hernia or abnormality. No abdominopelvic ascites. Musculoskeletal: No acute or significant osseous findings. IMPRESSION: 6 mm left mid ureteral stone with mild hydronephrosis. The focal abnormality is noted. Electronically Signed   By: Alcide Clever M.D.   On: 07/02/2022 22:41    Procedures Procedures    Medications Ordered in ED Medications  ondansetron (ZOFRAN) injection 4 mg (4 mg Intravenous Given 07/02/22 2218)  morphine (PF) 4 MG/ML injection 4 mg (4 mg Intravenous Given 07/02/22 2244)  ketorolac (TORADOL) 15 MG/ML injection 15 mg (15 mg Intravenous Given 07/02/22 2244)  morphine (PF) 4 MG/ML injection 4 mg (4 mg Intravenous Given 07/03/22 0003)  ondansetron (ZOFRAN) injection 4 mg (4 mg Intravenous Given 07/03/22 0002)  HYDROmorphone (DILAUDID) injection 1 mg (1 mg Intravenous Given 07/03/22 0116)  ondansetron (ZOFRAN-ODT) disintegrating tablet 8 mg (8 mg Oral Given 07/03/22 0200)    ED Course/ Medical Decision Making/ A&P                             Medical Decision Making Amount and/or Complexity of Data Reviewed Labs: ordered. Radiology: ordered.  Risk Prescription drug management.   This patient is a 52 y.o. male  who presents to the ED for concern of sudden onset left flank pain.   Differential diagnoses prior to evaluation: The emergent differential diagnosis includes, but is not limited to,  AAA, renal artery/vein embolism/thrombosis, mesenteric ischemia, pyelonephritis, nephrolithiasis, cystitis, biliary colic, pancreatitis, perforated peptic ulcer, appendicitis, diverticulitis, bowel obstruction, Testicular torsion, Epididymitis . This is not an exhaustive differential.   Past Medical History / Co-morbidities: Multiple previous kidney stones, acid reflux, anxiety, hemorrhoids, hypertension  Additional history: Chart reviewed. Pertinent results include:  Reviewed outpatient family medicine, gastroenterology, lab work, imaging from previous emergency department visits for  Physical Exam: Physical exam performed. The pertinent findings include: Patient with focal left flank pain tenderness, left CVA tenderness.  He is quite uncomfortable looking on initial exam, limiting his exam due to pain.  Lab Tests/Imaging studies: I personally interpreted labs/imaging and the pertinent results include:  CMP notable for mild hypokalemia, potassium 3.4, he does have an elevated bicarbonate 34 of unclear etiology.  Calcium mildly elevated 10.4, CMP is overall unremarkable otherwise.  Lipase normal.  UA notable for high specific gravity at 1.035, his hemoglobin, protein, and ketones but no evidence of white blood cells, bacteria..  I independently interpreted CT renal stone study which shows 6 mm stone in the left ureter with mild hydronephrosis.  I agree with the radiologist interpretation.  Medication: I ordered medication including Toradol, morphine for pain, Zofran for nausea.  I have reviewed the patients home medicines and have made adjustments as needed.    Care of Jafari Kratt transferred to Dr. Judd Lien at the end of my shift as the patient will require reassessment once labs/imaging have resulted. Patient presentation, ED course, and plan of care discussed with review of all pertinent labs and imaging. Please see his/her note for further details regarding further ED course and disposition. Plan at time of handoff is still in significant pain at time my handoff, he will be stable for discharge with urologic follow-up, pain medicine, nausea medication, Flomax as long as we can control his pain in the emergency department.. This may be altered or completely changed at the discretion of the oncoming team pending results of further workup.   Final Clinical Impression(s) / ED Diagnoses Final diagnoses:  Kidney stone  Renal calculus    Rx / DC Orders ED  Discharge Orders          Ordered    oxyCODONE-acetaminophen (PERCOCET) 5-325 MG tablet  Every 4 hours PRN        07/03/22 0147    ondansetron (ZOFRAN-ODT) 8 MG disintegrating tablet        07/03/22 0147              Love Chowning, [place] H, PA-C 07/03/22 1547

## 2022-07-02 NOTE — ED Triage Notes (Signed)
Reports L flank pain x45 minutes. Hx of kidney stones. Also reports N/V

## 2022-07-03 MED ORDER — ONDANSETRON 4 MG PO TBDP
8.0000 mg | ORAL_TABLET | Freq: Once | ORAL | Status: AC
  Administered 2022-07-03: 8 mg via ORAL
  Filled 2022-07-03: qty 2

## 2022-07-03 MED ORDER — HYDROMORPHONE HCL 1 MG/ML IJ SOLN
1.0000 mg | Freq: Once | INTRAMUSCULAR | Status: AC
Start: 2022-07-03 — End: 2022-07-03
  Administered 2022-07-03: 1 mg via INTRAVENOUS
  Filled 2022-07-03: qty 1

## 2022-07-03 MED ORDER — ONDANSETRON 8 MG PO TBDP: ORAL_TABLET | ORAL | 0 refills | Status: AC

## 2022-07-03 MED ORDER — OXYCODONE-ACETAMINOPHEN 5-325 MG PO TABS: 2.0000 | ORAL_TABLET | ORAL | 0 refills | Status: AC | PRN

## 2022-07-03 NOTE — ED Provider Notes (Signed)
  Physical Exam  BP (!) 138/97   Pulse 66   Temp 98.4 F (36.9 C) (Oral)   Resp 18   Ht 5\' 8"  (1.727 m)   Wt 81.6 kg   SpO2 98%   BMI 27.37 kg/m   Physical Exam Vitals and nursing note reviewed.  Constitutional:      General: He is not in acute distress.    Appearance: He is well-developed. He is not diaphoretic.  HENT:     Head: Normocephalic and atraumatic.  Cardiovascular:     Heart sounds: No murmur heard.    No friction rub.  Pulmonary:     Effort: Pulmonary effort is normal.  Musculoskeletal:        General: Normal range of motion.     Cervical back: Normal range of motion and neck supple.  Skin:    General: Skin is warm and dry.  Neurological:     Mental Status: He is alert and oriented to person, place, and time.     Coordination: Coordination normal.     Procedures  Procedures  ED Course / MDM    Medical Decision Making Amount and/or Complexity of Data Reviewed Labs: ordered. Radiology: ordered.  Risk Prescription drug management.   Care assumed from Dr. Donnald Garre and PA Ephriam Knuckles Prospieri at shift change.  Patient presenting here with flank pain and has an 8 mm mid ureteral stone.  Patient has received multiple doses of pain medicine and his pain is now 0.  He feels as though he can go home.  He does have a urologist to follow-up with.       Geoffery Lyons, MD 07/03/22 [phone]

## 2022-07-03 NOTE — ED Notes (Signed)
2L O2 applied d/t O2 @ 88% p Dilaudid

## 2022-07-05 ENCOUNTER — Other Ambulatory Visit: Payer: Self-pay | Admitting: Urology

## 2022-07-06 ENCOUNTER — Encounter (HOSPITAL_BASED_OUTPATIENT_CLINIC_OR_DEPARTMENT_OTHER): Payer: Self-pay | Admitting: Urology

## 2022-07-06 NOTE — Progress Notes (Signed)
Left message to call for lithotripsy instructions.

## 2022-07-06 NOTE — Progress Notes (Signed)
Pre ESL instructions given to patient.  His ride and caregiver will by his wife Aram Beecham [phone].  Patient will stop ASA 81 mg.

## 2022-07-08 ENCOUNTER — Encounter (HOSPITAL_BASED_OUTPATIENT_CLINIC_OR_DEPARTMENT_OTHER): Payer: Self-pay | Admitting: Urology

## 2022-07-08 ENCOUNTER — Ambulatory Visit (HOSPITAL_BASED_OUTPATIENT_CLINIC_OR_DEPARTMENT_OTHER)
Admission: RE | Admit: 2022-07-08 | Discharge: 2022-07-08 | Disposition: A | Discharge status: Home / Self Care | Payer: 59 | Attending: Urology | Admitting: Urology

## 2022-07-08 ENCOUNTER — Ambulatory Visit (HOSPITAL_COMMUNITY): Payer: 59

## 2022-07-08 ENCOUNTER — Encounter (HOSPITAL_BASED_OUTPATIENT_CLINIC_OR_DEPARTMENT_OTHER)
Admission: RE | Disposition: A | Discharge status: Home / Self Care | Payer: Self-pay | Source: Home / Self Care | Attending: Urology

## 2022-07-08 DIAGNOSIS — I10 Essential (primary) hypertension: Secondary | ICD-10-CM | POA: Insufficient documentation

## 2022-07-08 DIAGNOSIS — N201 Calculus of ureter: Secondary | ICD-10-CM | POA: Insufficient documentation

## 2022-07-08 DIAGNOSIS — G4730 Sleep apnea, unspecified: Secondary | ICD-10-CM | POA: Diagnosis not present

## 2022-07-08 HISTORY — PX: EXTRACORPOREAL SHOCK WAVE LITHOTRIPSY: SHX1557

## 2022-07-08 SURGERY — LITHOTRIPSY, ESWL
Anesthesia: LOCAL | Laterality: Left

## 2022-07-08 MED ORDER — OXYCODONE HCL 5 MG PO TABS
5.0000 mg | ORAL_TABLET | Freq: Once | ORAL | Status: AC
  Administered 2022-07-08: 5 mg via ORAL

## 2022-07-08 MED ORDER — DIAZEPAM 5 MG PO TABS
10.0000 mg | ORAL_TABLET | ORAL | Status: AC
  Administered 2022-07-08: 10 mg via ORAL

## 2022-07-08 MED ORDER — KETOROLAC TROMETHAMINE 30 MG/ML IJ SOLN
INTRAMUSCULAR | Status: AC
  Filled 2022-07-08: qty 1

## 2022-07-08 MED ORDER — DIPHENHYDRAMINE HCL 25 MG PO CAPS
ORAL_CAPSULE | ORAL | Status: AC
  Filled 2022-07-08: qty 1

## 2022-07-08 MED ORDER — DIAZEPAM 5 MG PO TABS
ORAL_TABLET | ORAL | Status: AC
  Filled 2022-07-08: qty 2

## 2022-07-08 MED ORDER — SODIUM CHLORIDE 0.9 % IV SOLN: INTRAVENOUS | Status: DC

## 2022-07-08 MED ORDER — KETOROLAC TROMETHAMINE 30 MG/ML IJ SOLN
30.0000 mg | Freq: Once | INTRAMUSCULAR | Status: AC
  Administered 2022-07-08: 30 mg via INTRAVENOUS

## 2022-07-08 MED ORDER — CIPROFLOXACIN HCL 500 MG PO TABS
500.0000 mg | ORAL_TABLET | ORAL | Status: AC
  Administered 2022-07-08: 500 mg via ORAL

## 2022-07-08 MED ORDER — CIPROFLOXACIN HCL 500 MG PO TABS
ORAL_TABLET | ORAL | Status: AC
  Filled 2022-07-08: qty 1

## 2022-07-08 MED ORDER — OXYCODONE HCL 5 MG PO TABS
ORAL_TABLET | ORAL | Status: AC
  Filled 2022-07-08: qty 1

## 2022-07-08 MED ORDER — DIPHENHYDRAMINE HCL 25 MG PO CAPS
25.0000 mg | ORAL_CAPSULE | ORAL | Status: AC
  Administered 2022-07-08: 25 mg via ORAL

## 2022-07-08 NOTE — H&P (Signed)
See scanned H&P

## 2022-07-08 NOTE — Progress Notes (Signed)
Dr. Alvester Morin called x 2 for pt's left anterior pain.  Oxycodone and Toradol IV orders given.  Pt states it feels like a huge gas bubble.  Pt walked in hall.  Urinated x 1 in toilet w/ sediment and small blood clot.

## 2022-07-08 NOTE — Op Note (Signed)
See [facility] OP note scanned into chart. Also because of the size, density, location and other factors that cannot be anticipated I feel this will likely be a staged procedure. This fact supersedes any indication in the scanned [facility] operative note to the contrary.  

## 2022-07-11 ENCOUNTER — Encounter (HOSPITAL_BASED_OUTPATIENT_CLINIC_OR_DEPARTMENT_OTHER): Payer: Self-pay | Admitting: Urology

## 2023-05-05 ENCOUNTER — Emergency Department (HOSPITAL_BASED_OUTPATIENT_CLINIC_OR_DEPARTMENT_OTHER): Payer: 59

## 2023-05-05 ENCOUNTER — Emergency Department (HOSPITAL_BASED_OUTPATIENT_CLINIC_OR_DEPARTMENT_OTHER)
Admission: EM | Admit: 2023-05-05 | Discharge: 2023-05-05 | Disposition: A | Discharge status: Home / Self Care | Payer: 59 | Attending: Emergency Medicine | Admitting: Emergency Medicine

## 2023-05-05 ENCOUNTER — Other Ambulatory Visit: Payer: Self-pay

## 2023-05-05 DIAGNOSIS — I10 Essential (primary) hypertension: Secondary | ICD-10-CM | POA: Diagnosis not present

## 2023-05-05 DIAGNOSIS — R1010 Upper abdominal pain, unspecified: Secondary | ICD-10-CM

## 2023-05-05 DIAGNOSIS — N200 Calculus of kidney: Secondary | ICD-10-CM | POA: Diagnosis not present

## 2023-05-05 DIAGNOSIS — R1011 Right upper quadrant pain: Secondary | ICD-10-CM | POA: Diagnosis present

## 2023-05-05 DIAGNOSIS — K429 Umbilical hernia without obstruction or gangrene: Secondary | ICD-10-CM | POA: Diagnosis not present

## 2023-05-05 DIAGNOSIS — Z7982 Long term (current) use of aspirin: Secondary | ICD-10-CM | POA: Insufficient documentation

## 2023-05-05 DIAGNOSIS — Z79899 Other long term (current) drug therapy: Secondary | ICD-10-CM | POA: Insufficient documentation

## 2023-05-05 DIAGNOSIS — R030 Elevated blood-pressure reading, without diagnosis of hypertension: Secondary | ICD-10-CM

## 2023-05-05 DIAGNOSIS — G8929 Other chronic pain: Secondary | ICD-10-CM

## 2023-05-05 LAB — URINALYSIS, ROUTINE W REFLEX MICROSCOPIC
Bacteria, UA: NONE SEEN
Bilirubin Urine: NEGATIVE
Glucose, UA: NEGATIVE mg/dL
Hgb urine dipstick: NEGATIVE
Ketones, ur: NEGATIVE mg/dL
Leukocytes,Ua: NEGATIVE
Nitrite: NEGATIVE
Protein, ur: NEGATIVE mg/dL
Specific Gravity, Urine: 1.016 (ref 1.005–1.030)
pH: 7.5 (ref 5.0–8.0)

## 2023-05-05 LAB — COMPREHENSIVE METABOLIC PANEL
ALT: 62 U/L — ABNORMAL HIGH (ref 0–44)
AST: 67 U/L — ABNORMAL HIGH (ref 15–41)
Albumin: 4.7 g/dL (ref 3.5–5.0)
Alkaline Phosphatase: 44 U/L (ref 38–126)
Anion gap: 7 (ref 5–15)
BUN: 17 mg/dL (ref 6–20)
CO2: 32 mmol/L (ref 22–32)
Calcium: 9 mg/dL (ref 8.9–10.3)
Chloride: 102 mmol/L (ref 98–111)
Creatinine, Ser: 1.14 mg/dL (ref 0.61–1.24)
GFR, Estimated: >60 mL/min (ref >60)
Glucose, Bld: 97 mg/dL (ref 70–99)
Potassium: 3.5 mmol/L (ref 3.5–5.1)
Sodium: 141 mmol/L (ref 135–145)
Total Bilirubin: 1.6 mg/dL — ABNORMAL HIGH (ref 0.0–1.2)
Total Protein: 7.1 g/dL (ref 6.5–8.1)

## 2023-05-05 LAB — CBC
HCT: 44.7 % (ref 39.0–52.0)
Hemoglobin: 15.2 g/dL (ref 13.0–17.0)
MCH: 29.4 pg (ref 26.0–34.0)
MCHC: 34 g/dL (ref 30.0–36.0)
MCV: 86.5 fL (ref 80.0–100.0)
Platelets: 260 10*3/uL (ref 150–400)
RBC: 5.17 MIL/uL (ref 4.22–5.81)
RDW: 12.1 % (ref 11.5–15.5)
WBC: 7.6 10*3/uL (ref 4.0–10.5)
nRBC: 0 % (ref 0.0–0.2)

## 2023-05-05 LAB — LIPASE, BLOOD: Lipase: 36 U/L (ref 11–51)

## 2023-05-05 MED ORDER — MORPHINE SULFATE (PF) 4 MG/ML IV SOLN
4.0000 mg | Freq: Once | INTRAVENOUS | Status: AC
  Administered 2023-05-05: 4 mg via INTRAVENOUS
  Filled 2023-05-05: qty 1

## 2023-05-05 MED ORDER — KETOROLAC TROMETHAMINE 15 MG/ML IJ SOLN
15.0000 mg | Freq: Once | INTRAMUSCULAR | Status: AC
  Administered 2023-05-05: 15 mg via INTRAVENOUS
  Filled 2023-05-05: qty 1

## 2023-05-05 MED ORDER — ONDANSETRON HCL 4 MG/2ML IJ SOLN
4.0000 mg | Freq: Once | INTRAMUSCULAR | Status: AC
  Administered 2023-05-05: 4 mg via INTRAVENOUS
  Filled 2023-05-05: qty 2

## 2023-05-05 NOTE — Discharge Instructions (Addendum)
 It was our pleasure to provide your ER care today - we hope that you feel better.  Drink plenty of fluids/stay well hydrated. Continue acid blocker medication.   Take acetaminophen  or ibuprofen  as need.   For CT scan was read as showing no definite cause of your pain. Incidental note was made of small right kidney stone and small umbilical hernia - follow up with your doctor.   For recurrent upper abdominal pain, follow up  closely with your doctor/GI doctor in the coming week. Discuss possible outpatient NM HIDA scan and/or additional workup.  Your blood pressure is high today - continue your meds and follow up with primary care doctor in the next couple weeks.   Return to ER if worse, new symptoms, fevers, new or worsening or severe abdominal pain, persistent vomiting, fevers, chest pain, trouble breathing, or other concern.   You were given pain meds in the ER - no driving for the next 6 hours.

## 2023-05-05 NOTE — ED Notes (Signed)
 Patient transported to CT

## 2023-05-05 NOTE — ED Triage Notes (Signed)
 C/o RUQ pain since last night, Denies n/v/d. Been seen w/ GI for same last month. Scheduled for endoscopy. Denies CP or SHOB.

## 2023-05-05 NOTE — ED Provider Notes (Addendum)
 [place] EMERGENCY DEPARTMENT AT [facility] Provider Note   CSN: [id number] Arrival date & time: 05/05/23  1041     History  Chief Complaint  Patient presents with   Abdominal Pain    Joshua Collins is a 52 y.o. male.  Pt c/o acute onset right upper quadrant/right flank pain last night, waxing/waning, dull, mod-severe, non radiating. No specific exacerbating or alleviating factors. Hx kidney stone. Also indicates similar pain to this for past few years, occasional worse w eating heavy/greasy foods. No hx gallstones. Has seen pcp and gi for same. Denies fever or chills. No back pain. No chest pain or sob. No vomiting or diarrhea.   The history is provided by the patient and medical records.  Abdominal Pain Associated symptoms: no chest pain, no cough, no diarrhea, no dysuria, no fever, no hematuria, no shortness of breath, no sore throat and no vomiting        Home Medications Prior to Admission medications   Medication Sig Start Date End Date Taking? Authorizing Provider  albuterol  (VENTOLIN  HFA) 108 (90 Base) MCG/ACT inhaler Inhale 1-2 puffs into the lungs every 6 (six) hours as needed for wheezing or shortness of breath. 05/07/21   Palumbo, April, MD  amLODipine (NORVASC) 10 MG tablet Take 10 mg by mouth at bedtime.  Patient not taking: Reported on 07/06/2022 04/19/19   [provider]  aspirin EC 81 MG tablet Take 81 mg by mouth daily. Swallow whole.    [provider]  BYSTOLIC 10 MG tablet Take 10 mg by mouth at bedtime.  12/25/13   [provider]  cetirizine (ZYRTEC) 10 MG tablet Take 10 mg by mouth at bedtime.     [provider]  famotidine (PEPCID) 40 MG tablet Take 40 mg by mouth at bedtime.    [provider]  hydrochlorothiazide (HYDRODIURIL) 25 MG tablet Take 25 mg by mouth daily.    [provider]  losartan-hydrochlorothiazide (HYZAAR) 100-25 MG tablet Take 1 tablet by mouth at bedtime.     [provider]  montelukast (SINGULAIR) 10 MG tablet Take 10 mg by mouth at bedtime.    [provider]  omeprazole  (PRILOSEC) 20 MG capsule Take 20 mg by mouth as needed.    [provider]  omeprazole  (PRILOSEC) 20 MG capsule Take 1 capsule (20 mg total) by mouth daily. 05/07/21   Palumbo, April, MD  ondansetron  (ZOFRAN -ODT) 8 MG disintegrating tablet 8mg  ODT q4 hours prn nausea 07/03/22   Geroldine Berg, MD  oxyCODONE  (OXY IR/ROXICODONE ) 5 MG immediate release tablet Take 1-2 tablets (5-10 mg total) by mouth every 6 (six) hours as needed for moderate pain, severe pain or breakthrough pain. Patient not taking: Reported on 07/06/2022 09/19/19   Sheldon Standing, MD  oxyCODONE -acetaminophen  (PERCOCET) 5-325 MG tablet Take 2 tablets by mouth every 4 (four) hours as needed. 07/03/22   Geroldine Berg, MD  pravastatin (PRAVACHOL) 20 MG tablet Take 20 mg by mouth daily.    [provider]  tamsulosin  (FLOMAX ) 0.4 MG CAPS capsule Take 0.4 mg by mouth.    [provider]  traMADol  (ULTRAM ) 50 MG tablet Take 50 mg by mouth every 6 (six) hours as needed.    [provider]      Allergies    Patient has no known allergies.    Review of Systems   Review of Systems  Constitutional:  Negative for fever.  HENT:  Negative for sore throat.   Respiratory:  Negative  for cough and shortness of breath.   Cardiovascular:  Negative for chest pain, palpitations and leg swelling.  Gastrointestinal:  Positive for abdominal pain. Negative for diarrhea and vomiting.  Genitourinary:  Positive for flank pain. Negative for dysuria, hematuria, scrotal swelling and testicular pain.  Musculoskeletal:  Negative for back pain and neck pain.  Neurological:  Negative for headaches.    Physical Exam Updated Vital Signs BP (!) 146/105   Pulse 78   Temp 98.1 F (36.7 C) (Oral)   Resp 12   SpO2 98%  Physical Exam Vitals and nursing note reviewed.  Constitutional:      Appearance:  Normal appearance. He is well-developed.  HENT:     Head: Atraumatic.     Nose: Nose normal.     Mouth/Throat:     Mouth: Mucous membranes are moist.  Eyes:     General: No scleral icterus.    Conjunctiva/sclera: Conjunctivae normal.  Neck:     Trachea: No tracheal deviation.  Cardiovascular:     Rate and Rhythm: Normal rate and regular rhythm.     Pulses: Normal pulses.     Heart sounds: Normal heart sounds. No murmur heard.    No friction rub. No gallop.  Pulmonary:     Effort: Pulmonary effort is normal. No accessory muscle usage or respiratory distress.     Breath sounds: Normal breath sounds.  Abdominal:     General: Bowel sounds are normal. There is no distension.     Palpations: Abdomen is soft. There is no mass.     Tenderness: There is no abdominal tenderness. There is no guarding.  Genitourinary:    Comments: No cva tenderness. Musculoskeletal:        General: No swelling or tenderness.     Cervical back: Normal range of motion and neck supple. No rigidity.     Right lower leg: No edema.     Left lower leg: No edema.  Skin:    General: Skin is warm and dry.     Findings: No rash.  Neurological:     Mental Status: He is alert.     Comments: Alert, speech clear.   Psychiatric:     Comments: Anxious appearing.      ED Results / Procedures / Treatments   Labs (all labs ordered are listed, but only abnormal results are displayed) Results for orders placed or performed during the hospital encounter of 05/05/23  Lipase, blood   Collection Time: 05/05/23 10:50 AM  Result Value Ref Range   Lipase 36 11 - 51 U/L  Comprehensive metabolic panel   Collection Time: 05/05/23 10:50 AM  Result Value Ref Range   Sodium 141 135 - 145 mmol/L   Potassium 3.5 3.5 - 5.1 mmol/L   Chloride 102 98 - 111 mmol/L   CO2 32 22 - 32 mmol/L   Glucose, Bld 97 70 - 99 mg/dL   BUN 17 6 - 20 mg/dL   Creatinine, Ser 8.85 0.61 - 1.24 mg/dL   Calcium 9.0 8.9 - 89.6 mg/dL   Total Protein  7.1 6.5 - 8.1 g/dL   Albumin 4.7 3.5 - 5.0 g/dL   AST 67 (H) 15 - 41 U/L   ALT 62 (H) 0 - 44 U/L   Alkaline Phosphatase 44 38 - 126 U/L   Total Bilirubin 1.6 (H) 0.0 - 1.2 mg/dL   GFR, Estimated >39 >39 mL/min   Anion gap 7 5 - 15  CBC   Collection Time: 05/05/23  10:50 AM  Result Value Ref Range   WBC 7.6 4.0 - 10.5 K/uL   RBC 5.17 4.22 - 5.81 MIL/uL   Hemoglobin 15.2 13.0 - 17.0 g/dL   HCT 55.2 60.9 - 47.9 %   MCV 86.5 80.0 - 100.0 fL   MCH 29.4 26.0 - 34.0 pg   MCHC 34.0 30.0 - 36.0 g/dL   RDW 87.8 88.4 - 84.4 %   Platelets 260 150 - 400 K/uL   nRBC 0.0 0.0 - 0.2 %  Urinalysis, Routine w reflex microscopic -Urine, Clean Catch   Collection Time: 05/05/23  1:28 PM  Result Value Ref Range   Color, Urine YELLOW YELLOW   APPearance CLOUDY (A) CLEAR   Specific Gravity, Urine 1.016 1.005 - 1.030   pH 7.5 5.0 - 8.0   Glucose, UA NEGATIVE NEGATIVE mg/dL   Hgb urine dipstick NEGATIVE NEGATIVE   Bilirubin Urine NEGATIVE NEGATIVE   Ketones, ur NEGATIVE NEGATIVE mg/dL   Protein, ur NEGATIVE NEGATIVE mg/dL   Nitrite NEGATIVE NEGATIVE   Leukocytes,Ua NEGATIVE NEGATIVE   RBC / HPF 0-5 0 - 5 RBC/hpf   WBC, UA 0-5 0 - 5 WBC/hpf   Bacteria, UA NONE SEEN NONE SEEN   Squamous Epithelial / HPF 0-5 0 - 5 /HPF   Mucus PRESENT    Amorphous Crystal PRESENT       EKG EKG Interpretation Date/Time:  Friday May 05 2023 11:36:39 EST Ventricular Rate:  69 PR Interval:  194 QRS Duration:  100 QT Interval:  393 QTC Calculation: 421 R Axis:   35  Text Interpretation: Sinus rhythm Baseline wander Confirmed by Bernard Drivers ([id number]) on 05/05/2023 12:46:42 PM  Radiology CT Renal Stone Study Result Date: 05/05/2023 CLINICAL DATA:  Abdominal/flank pain, stone suspected right flank pain. Right upper quadrant pain. EXAM: CT ABDOMEN AND PELVIS WITHOUT CONTRAST TECHNIQUE: Multidetector CT imaging of the abdomen and pelvis was performed following the standard protocol without IV contrast. RADIATION  DOSE REDUCTION: This exam was performed according to the departmental dose-optimization program which includes automated exposure control, adjustment of the mA and/or kV according to patient size and/or use of iterative reconstruction technique. COMPARISON:  CT scan renal stone protocol from 07/02/2022. FINDINGS: Lower chest: The lung bases are clear. No pleural effusion. The heart is normal in size. No pericardial effusion. Hepatobiliary: The liver is normal in size. Non-cirrhotic configuration. No suspicious mass. These is mild diffuse hepatic steatosis. No intrahepatic or extrahepatic bile duct dilation. No calcified gallstones. Normal gallbladder wall thickness. No pericholecystic inflammatory changes. Pancreas: Unremarkable. No pancreatic ductal dilatation or surrounding inflammatory changes. Spleen: Within normal limits. No focal lesion. Adrenals/Urinary Tract: Adrenal glands are unremarkable. No suspicious renal mass within the limitations of this unenhanced exam. There is a single 1 mm nonobstructing calculus in the right kidney lower pole calyx. No other nephroureterolithiasis on either side. No obstructive uropathy on either side. Unremarkable urinary bladder. Stomach/Bowel: There is a small sliding hiatal hernia. No disproportionate dilation of the small or large bowel loops. No evidence of abnormal bowel wall thickening or inflammatory changes. The appendix is unremarkable. Vascular/Lymphatic: No ascites or pneumoperitoneum. No abdominal or pelvic lymphadenopathy, by size criteria. No aneurysmal dilation of the major abdominal arteries. There are mild peripheral atherosclerotic vascular calcifications of the aorta and its major branches. Reproductive: Normal size prostate. Symmetric seminal vesicles. Other: There is a tiny fat containing umbilical hernia. The soft tissues and abdominal wall are otherwise unremarkable. Musculoskeletal: No suspicious osseous lesions. There are mild multilevel degenerative  changes in the visualized spine. IMPRESSION: *There is a single 1 mm nonobstructing calculus in the right kidney lower pole calyx. No other nephroureterolithiasis or obstructive uropathy on either side. *Multiple other nonacute observations, as described above. Aortic Atherosclerosis (ICD10-I70.0). Electronically Signed   By: Ree Molt M.D.   On: 05/05/2023 12:49    Procedures Procedures    Medications Ordered in ED Medications  morphine  (PF) 4 MG/ML injection 4 mg (4 mg Intravenous Given 05/05/23 1153)  ondansetron  (ZOFRAN ) injection 4 mg (4 mg Intravenous Given 05/05/23 1151)  ketorolac  (TORADOL ) 15 MG/ML injection 15 mg (15 mg Intravenous Given 05/05/23 1508)  morphine  (PF) 4 MG/ML injection 4 mg (4 mg Intravenous Given 05/05/23 1509)  ondansetron  (ZOFRAN ) injection 4 mg (4 mg Intravenous Given 05/05/23 1506)    ED Course/ Medical Decision Making/ A&P                                 Medical Decision Making Problems Addressed: Chronic abdominal pain: chronic illness or injury with exacerbation, progression, or side effects of treatment Elevated blood pressure reading: acute illness or injury Essential hypertension: chronic illness or injury with exacerbation, progression, or side effects of treatment that poses a threat to life or bodily functions Right kidney stone: chronic illness or injury Umbilical hernia without obstruction and without gangrene: chronic illness or injury Upper abdominal pain: acute illness or injury with systemic symptoms that poses a threat to life or bodily functions    Details: Acute on chronic  Amount and/or Complexity of Data Reviewed External Data Reviewed: notes. Labs: ordered. Decision-making details documented in ED Course. Radiology: ordered and independent interpretation performed. Decision-making details documented in ED Course. ECG/medicine tests: ordered and independent interpretation performed. Decision-making details documented in ED  Course.  Risk Prescription drug management. Parenteral controlled substances. Decision regarding hospitalization.   Iv ns. Continuous pulse ox and cardiac monitoring. Labs ordered/sent. Imaging ordered.   Differential diagnosis includes biliary colic, renal colic, etc. Dispo decision including potential need for admission considered - will get labs and imaging and reassess.   Reviewed nursing notes and prior charts for additional history. External reports reviewed.  Morphine  iv. Zofran  iv. Ivf.   Cardiac monitor: sinus rhythm, rate 70.  Labs reviewed/interpreted by me - wbc normal. Lipase normal.   CT reviewed/interpreted by me - no acute process. Incidental findings share w pt.   Recheck, pain much improved. No nv. Abd soft non tender.   After initially noting pain improvement, after discussion ct reading, return of pain now right flank and bit lower/posteriorly. Abd soft non tender.   Additional pain medication provided.   Recheck, pt appears comfortable.   Pt currently appears stable for d/c.   Rec close pcp/GI f/u.  Return precautions provided.          Final Clinical Impression(s) / ED Diagnoses Final diagnoses:  Upper abdominal pain  Chronic abdominal pain  Right kidney stone  Umbilical hernia without obstruction and without gangrene  Elevated blood pressure reading  Essential hypertension    Rx / DC Orders ED Discharge Orders     None          Bernard Drivers, MD 05/05/23 [phone]
